# Patient Record
Sex: Male | Born: 1960 | Race: White | Hispanic: No | Marital: Married | Smoking: Current every day smoker
Health system: Southern US, Community
[De-identification: ages and names within clinical notes are randomized; demographics above are authoritative.]

## PROBLEM LIST (undated history)

## (undated) DIAGNOSIS — I219 Acute myocardial infarction, unspecified: Secondary | ICD-10-CM

## (undated) DIAGNOSIS — I509 Heart failure, unspecified: Secondary | ICD-10-CM

## (undated) DIAGNOSIS — F329 Major depressive disorder, single episode, unspecified: Secondary | ICD-10-CM

## (undated) DIAGNOSIS — E785 Hyperlipidemia, unspecified: Secondary | ICD-10-CM

## (undated) DIAGNOSIS — E119 Type 2 diabetes mellitus without complications: Secondary | ICD-10-CM

## (undated) DIAGNOSIS — T7840XA Allergy, unspecified, initial encounter: Secondary | ICD-10-CM

## (undated) DIAGNOSIS — M1990 Unspecified osteoarthritis, unspecified site: Secondary | ICD-10-CM

## (undated) DIAGNOSIS — I10 Essential (primary) hypertension: Secondary | ICD-10-CM

## (undated) DIAGNOSIS — F32A Depression, unspecified: Secondary | ICD-10-CM

## (undated) DIAGNOSIS — K219 Gastro-esophageal reflux disease without esophagitis: Secondary | ICD-10-CM

## (undated) DIAGNOSIS — F419 Anxiety disorder, unspecified: Secondary | ICD-10-CM

## (undated) DIAGNOSIS — G629 Polyneuropathy, unspecified: Secondary | ICD-10-CM

## (undated) HISTORY — DX: Allergy, unspecified, initial encounter: T78.40XA

## (undated) HISTORY — DX: Depression, unspecified: F32.A

## (undated) HISTORY — PX: CARDIAC VALVE REPLACEMENT: SHX585

## (undated) HISTORY — DX: Unspecified osteoarthritis, unspecified site: M19.90

## (undated) HISTORY — DX: Heart failure, unspecified: I50.9

## (undated) HISTORY — DX: Anxiety disorder, unspecified: F41.9

## (undated) HISTORY — DX: Acute myocardial infarction, unspecified: I21.9

## (undated) HISTORY — DX: Major depressive disorder, single episode, unspecified: F32.9

---

## 2005-11-28 ENCOUNTER — Ambulatory Visit: Payer: Self-pay | Admitting: Urology

## 2008-10-17 ENCOUNTER — Emergency Department: Payer: Self-pay | Admitting: Emergency Medicine

## 2014-11-27 ENCOUNTER — Emergency Department: Payer: Medicaid Other

## 2014-11-27 ENCOUNTER — Encounter
Admission: EM | Disposition: A | Discharge status: Home / Self Care | Payer: Self-pay | Source: Home / Self Care | Attending: Cardiovascular Disease

## 2014-11-27 ENCOUNTER — Inpatient Hospital Stay
Admission: EM | Admit: 2014-11-27 | Discharge: 2014-11-29 | DRG: 247 | Disposition: A | Discharge status: Home / Self Care | Payer: Medicaid Other | Attending: Cardiovascular Disease | Admitting: Cardiovascular Disease

## 2014-11-27 ENCOUNTER — Encounter: Payer: Self-pay | Admitting: Emergency Medicine

## 2014-11-27 ENCOUNTER — Inpatient Hospital Stay (HOSPITAL_COMMUNITY)
Admit: 2014-11-27 | Discharge: 2014-11-27 | Disposition: A | Discharge status: Home / Self Care | Payer: Medicaid Other | Attending: Cardiovascular Disease | Admitting: Cardiovascular Disease

## 2014-11-27 DIAGNOSIS — I2119 ST elevation (STEMI) myocardial infarction involving other coronary artery of inferior wall: Secondary | ICD-10-CM | POA: Diagnosis present

## 2014-11-27 DIAGNOSIS — M549 Dorsalgia, unspecified: Secondary | ICD-10-CM | POA: Diagnosis present

## 2014-11-27 DIAGNOSIS — Z886 Allergy status to analgesic agent status: Secondary | ICD-10-CM | POA: Diagnosis not present

## 2014-11-27 DIAGNOSIS — K219 Gastro-esophageal reflux disease without esophagitis: Secondary | ICD-10-CM | POA: Diagnosis present

## 2014-11-27 DIAGNOSIS — E785 Hyperlipidemia, unspecified: Secondary | ICD-10-CM | POA: Diagnosis present

## 2014-11-27 DIAGNOSIS — R079 Chest pain, unspecified: Secondary | ICD-10-CM | POA: Insufficient documentation

## 2014-11-27 DIAGNOSIS — Z833 Family history of diabetes mellitus: Secondary | ICD-10-CM

## 2014-11-27 DIAGNOSIS — G8929 Other chronic pain: Secondary | ICD-10-CM | POA: Diagnosis present

## 2014-11-27 DIAGNOSIS — F17210 Nicotine dependence, cigarettes, uncomplicated: Secondary | ICD-10-CM | POA: Diagnosis present

## 2014-11-27 DIAGNOSIS — E1142 Type 2 diabetes mellitus with diabetic polyneuropathy: Secondary | ICD-10-CM | POA: Diagnosis present

## 2014-11-27 DIAGNOSIS — Z716 Tobacco abuse counseling: Secondary | ICD-10-CM | POA: Insufficient documentation

## 2014-11-27 DIAGNOSIS — I2510 Atherosclerotic heart disease of native coronary artery without angina pectoris: Secondary | ICD-10-CM

## 2014-11-27 DIAGNOSIS — I2121 ST elevation (STEMI) myocardial infarction involving left circumflex coronary artery: Secondary | ICD-10-CM | POA: Diagnosis not present

## 2014-11-27 DIAGNOSIS — G629 Polyneuropathy, unspecified: Secondary | ICD-10-CM | POA: Diagnosis present

## 2014-11-27 DIAGNOSIS — I10 Essential (primary) hypertension: Secondary | ICD-10-CM | POA: Diagnosis present

## 2014-11-27 DIAGNOSIS — I209 Angina pectoris, unspecified: Secondary | ICD-10-CM | POA: Diagnosis not present

## 2014-11-27 DIAGNOSIS — Z8249 Family history of ischemic heart disease and other diseases of the circulatory system: Secondary | ICD-10-CM

## 2014-11-27 DIAGNOSIS — I213 ST elevation (STEMI) myocardial infarction of unspecified site: Secondary | ICD-10-CM

## 2014-11-27 DIAGNOSIS — E1165 Type 2 diabetes mellitus with hyperglycemia: Secondary | ICD-10-CM | POA: Diagnosis present

## 2014-11-27 DIAGNOSIS — Z720 Tobacco use: Secondary | ICD-10-CM | POA: Diagnosis not present

## 2014-11-27 DIAGNOSIS — R001 Bradycardia, unspecified: Secondary | ICD-10-CM | POA: Diagnosis present

## 2014-11-27 HISTORY — DX: Hyperlipidemia, unspecified: E78.5

## 2014-11-27 HISTORY — DX: Gastro-esophageal reflux disease without esophagitis: K21.9

## 2014-11-27 HISTORY — DX: Type 2 diabetes mellitus without complications: E11.9

## 2014-11-27 HISTORY — PX: CARDIAC CATHETERIZATION: SHX172

## 2014-11-27 HISTORY — DX: Essential (primary) hypertension: I10

## 2014-11-27 HISTORY — DX: Polyneuropathy, unspecified: G62.9

## 2014-11-27 LAB — CBC WITH DIFFERENTIAL/PLATELET
BASOS ABS: 0.1 10*3/uL (ref 0–0.1)
BASOS PCT: 1 %
Eosinophils Absolute: 0.3 10*3/uL (ref 0–0.7)
Eosinophils Relative: 2 %
HCT: 53.6 % — ABNORMAL HIGH (ref 40.0–52.0)
Hemoglobin: 17.9 g/dL (ref 13.0–18.0)
LYMPHS ABS: 4 10*3/uL — AB (ref 1.0–3.6)
LYMPHS PCT: 29 %
MCH: 29.5 pg (ref 26.0–34.0)
MCHC: 33.4 g/dL (ref 32.0–36.0)
MCV: 88.3 fL (ref 80.0–100.0)
MONO ABS: 1.2 10*3/uL — AB (ref 0.2–1.0)
Monocytes Relative: 9 %
Neutro Abs: 8.5 10*3/uL — ABNORMAL HIGH (ref 1.4–6.5)
Neutrophils Relative %: 59 %
Platelets: 214 10*3/uL (ref 150–440)
RBC: 6.07 MIL/uL — ABNORMAL HIGH (ref 4.40–5.90)
RDW: 12.8 % (ref 11.5–14.5)
WBC: 14.1 10*3/uL — AB (ref 3.8–10.6)

## 2014-11-27 LAB — BASIC METABOLIC PANEL
Anion gap: 13 (ref 5–15)
BUN: 12 mg/dL (ref 6–20)
CALCIUM: 9.7 mg/dL (ref 8.9–10.3)
CO2: 24 mmol/L (ref 22–32)
CREATININE: 0.93 mg/dL (ref 0.61–1.24)
Chloride: 101 mmol/L (ref 101–111)
GFR calc Af Amer: >60 mL/min (ref >60)
GFR calc non Af Amer: >60 mL/min (ref >60)
GLUCOSE: 332 mg/dL — AB (ref 65–99)
Potassium: 4.1 mmol/L (ref 3.5–5.1)
Sodium: 138 mmol/L (ref 135–145)

## 2014-11-27 LAB — GLUCOSE, CAPILLARY
Glucose-Capillary: 218 mg/dL — ABNORMAL HIGH (ref 65–99)
Glucose-Capillary: 223 mg/dL — ABNORMAL HIGH (ref 65–99)
Glucose-Capillary: 249 mg/dL — ABNORMAL HIGH (ref 65–99)
Glucose-Capillary: 404 mg/dL — ABNORMAL HIGH (ref 65–99)

## 2014-11-27 LAB — TROPONIN I
Troponin I: <0.03 ng/mL (ref <0.031)
Troponin I: 32.86 ng/mL — ABNORMAL HIGH (ref <0.031)

## 2014-11-27 SURGERY — CORONARY STENT INTERVENTION

## 2014-11-27 MED ORDER — NITROGLYCERIN 0.4 MG SL SUBL
0.4000 mg | SUBLINGUAL_TABLET | SUBLINGUAL | Status: DC | PRN
  Administered 2014-11-27 (×2): 0.4 mg via SUBLINGUAL

## 2014-11-27 MED ORDER — HEPARIN (PORCINE) IN NACL 2-0.9 UNIT/ML-% IJ SOLN
INTRAMUSCULAR | Status: AC
  Filled 2014-11-27: qty 1000

## 2014-11-27 MED ORDER — PRASUGREL HCL 10 MG PO TABS
10.0000 mg | ORAL_TABLET | Freq: Every day | ORAL | Status: DC
  Administered 2014-11-28 – 2014-11-29 (×2): 10 mg via ORAL
  Filled 2014-11-27 (×2): qty 1

## 2014-11-27 MED ORDER — BENAZEPRIL HCL 10 MG PO TABS
10.0000 mg | ORAL_TABLET | Freq: Every day | ORAL | Status: DC
  Administered 2014-11-28 – 2014-11-29 (×2): 10 mg via ORAL
  Filled 2014-11-27: qty 1
  Filled 2014-11-27: qty 0.5

## 2014-11-27 MED ORDER — SODIUM CHLORIDE 0.9 % IV SOLN
INTRAVENOUS | Status: AC
  Administered 2014-11-27: 11:00:00 via INTRAVENOUS

## 2014-11-27 MED ORDER — BIVALIRUDIN BOLUS VIA INFUSION - CUPID
INTRAVENOUS | Status: DC | PRN
  Administered 2014-11-27: 76.575 mg via INTRAVENOUS

## 2014-11-27 MED ORDER — HEPARIN SODIUM (PORCINE) 5000 UNIT/ML IJ SOLN
4000.0000 [IU] | Freq: Once | INTRAMUSCULAR | Status: AC
  Administered 2014-11-27: 4000 [IU] via INTRAVENOUS

## 2014-11-27 MED ORDER — IOHEXOL 300 MG/ML  SOLN
INTRAMUSCULAR | Status: DC | PRN
  Administered 2014-11-27: 195 mL via INTRAVENOUS

## 2014-11-27 MED ORDER — ASPIRIN 81 MG PO CHEW
81.0000 mg | CHEWABLE_TABLET | Freq: Every day | ORAL | Status: DC
  Administered 2014-11-28 – 2014-11-29 (×2): 81 mg via ORAL
  Filled 2014-11-27 (×2): qty 1

## 2014-11-27 MED ORDER — SODIUM CHLORIDE 0.9 % IJ SOLN: 3.0000 mL | INTRAMUSCULAR | Status: DC | PRN

## 2014-11-27 MED ORDER — SODIUM CHLORIDE 0.9 % IJ SOLN
3.0000 mL | Freq: Two times a day (BID) | INTRAMUSCULAR | Status: DC
  Administered 2014-11-27 – 2014-11-28 (×3): 3 mL via INTRAVENOUS

## 2014-11-27 MED ORDER — ATORVASTATIN CALCIUM 20 MG PO TABS
80.0000 mg | ORAL_TABLET | Freq: Every day | ORAL | Status: DC
  Administered 2014-11-27 – 2014-11-28 (×2): 80 mg via ORAL
  Filled 2014-11-27: qty 4
  Filled 2014-11-27: qty 1
  Filled 2014-11-27: qty 4

## 2014-11-27 MED ORDER — PRASUGREL HCL 10 MG PO TABS
ORAL_TABLET | ORAL | Status: DC | PRN
  Administered 2014-11-27: 60 mg via ORAL

## 2014-11-27 MED ORDER — BENAZEPRIL HCL 5 MG PO TABS
5.0000 mg | ORAL_TABLET | Freq: Once | ORAL | Status: AC
  Administered 2014-11-27: 5 mg via ORAL
  Filled 2014-11-27 (×2): qty 1

## 2014-11-27 MED ORDER — ACETAMINOPHEN 325 MG PO TABS: 650.0000 mg | ORAL_TABLET | ORAL | Status: DC | PRN

## 2014-11-27 MED ORDER — ONDANSETRON HCL 4 MG/2ML IJ SOLN: 4.0000 mg | Freq: Four times a day (QID) | INTRAMUSCULAR | Status: DC | PRN

## 2014-11-27 MED ORDER — BENAZEPRIL HCL 5 MG PO TABS
5.0000 mg | ORAL_TABLET | Freq: Every day | ORAL | Status: DC
  Administered 2014-11-27: 5 mg via ORAL
  Filled 2014-11-27: qty 1

## 2014-11-27 MED ORDER — FENTANYL CITRATE (PF) 100 MCG/2ML IJ SOLN
INTRAMUSCULAR | Status: DC | PRN
  Administered 2014-11-27: 50 ug via INTRAVENOUS

## 2014-11-27 MED ORDER — NITROGLYCERIN 0.2 MG/ML ON CALL CATH LAB
INTRAVENOUS | Status: DC | PRN
  Administered 2014-11-27 (×2): 200 ug via INTRACORONARY

## 2014-11-27 MED ORDER — ALPRAZOLAM 0.5 MG PO TABS
0.5000 mg | ORAL_TABLET | Freq: Two times a day (BID) | ORAL | Status: DC | PRN
  Administered 2014-11-27 – 2014-11-29 (×3): 0.5 mg via ORAL
  Filled 2014-11-27 (×3): qty 1

## 2014-11-27 MED ORDER — SODIUM CHLORIDE 0.9 % IV SOLN
250.0000 mg | INTRAVENOUS | Status: DC | PRN
  Administered 2014-11-27: 1.75 mg/kg/h via INTRAVENOUS

## 2014-11-27 MED ORDER — MIDAZOLAM HCL 2 MG/2ML IJ SOLN
INTRAMUSCULAR | Status: DC | PRN
  Administered 2014-11-27: 1 mg via INTRAVENOUS

## 2014-11-27 MED ORDER — BIVALIRUDIN 250 MG IV SOLR
INTRAVENOUS | Status: AC
Start: 2014-11-27 — End: 2014-11-27
  Filled 2014-11-27: qty 250

## 2014-11-27 MED ORDER — PRASUGREL HCL 10 MG PO TABS
ORAL_TABLET | ORAL | Status: AC
  Filled 2014-11-27: qty 6

## 2014-11-27 MED ORDER — INSULIN ASPART 100 UNIT/ML ~~LOC~~ SOLN
0.0000 [IU] | SUBCUTANEOUS | Status: DC
  Administered 2014-11-27: 8 [IU] via SUBCUTANEOUS
  Administered 2014-11-27: 20 [IU] via SUBCUTANEOUS
  Administered 2014-11-27 – 2014-11-28 (×3): 8 [IU] via SUBCUTANEOUS
  Administered 2014-11-28: 4 [IU] via SUBCUTANEOUS
  Filled 2014-11-27: qty 4
  Filled 2014-11-27: qty 12
  Filled 2014-11-27 (×2): qty 8
  Filled 2014-11-27: qty 20
  Filled 2014-11-27: qty 8

## 2014-11-27 MED ORDER — MIDAZOLAM HCL 2 MG/2ML IJ SOLN
INTRAMUSCULAR | Status: AC
  Filled 2014-11-27: qty 2

## 2014-11-27 MED ORDER — NITROGLYCERIN 2 % TD OINT
0.5000 [in_us] | TOPICAL_OINTMENT | Freq: Four times a day (QID) | TRANSDERMAL | Status: DC
  Administered 2014-11-27 – 2014-11-28 (×4): 0.5 [in_us] via TOPICAL
  Filled 2014-11-27 (×4): qty 1

## 2014-11-27 MED ORDER — CARVEDILOL 3.125 MG PO TABS
3.1250 mg | ORAL_TABLET | Freq: Two times a day (BID) | ORAL | Status: DC
  Administered 2014-11-27 – 2014-11-29 (×4): 3.125 mg via ORAL
  Filled 2014-11-27 (×4): qty 1

## 2014-11-27 MED ORDER — SODIUM CHLORIDE 0.9 % IV SOLN: 250.0000 mL | INTRAVENOUS | Status: DC | PRN

## 2014-11-27 MED ORDER — FENTANYL CITRATE (PF) 100 MCG/2ML IJ SOLN
INTRAMUSCULAR | Status: AC
  Filled 2014-11-27: qty 2

## 2014-11-27 MED ORDER — ASPIRIN 81 MG PO CHEW
324.0000 mg | CHEWABLE_TABLET | Freq: Once | ORAL | Status: AC
  Administered 2014-11-27: 324 mg via ORAL

## 2014-11-27 MED ORDER — VERAPAMIL HCL 2.5 MG/ML IV SOLN
INTRAVENOUS | Status: AC
  Filled 2014-11-27: qty 2

## 2014-11-27 MED ORDER — VERAPAMIL HCL 2.5 MG/ML IV SOLN
INTRAVENOUS | Status: DC | PRN
  Administered 2014-11-27: 10:00:00 via INTRA_ARTERIAL

## 2014-11-27 MED ORDER — NITROGLYCERIN 5 MG/ML IV SOLN
INTRAVENOUS | Status: AC
  Filled 2014-11-27: qty 10

## 2014-11-27 SURGICAL SUPPLY — 16 items
BALLN TREK RX 2.5X12 (BALLOONS) ×3
BALLN ~~LOC~~ TREK RX 3.25X12 (BALLOONS) ×3
BALLOON TREK RX 2.5X12 (BALLOONS) ×1 IMPLANT
BALLOON ~~LOC~~ TREK RX 3.25X12 (BALLOONS) ×1 IMPLANT
CATH HEARTRAIL 6F IL3.5 (CATHETERS) ×3 IMPLANT
CATH INFINITI 5 FR JL3.5 (CATHETERS) ×3 IMPLANT
CATH INFINITI 5FR ANG PIGTAIL (CATHETERS) ×3 IMPLANT
CATH OPTITORQUE JACKY 4.0 5F (CATHETERS) ×3 IMPLANT
DEVICE INFLAT 30 PLUS (MISCELLANEOUS) ×3 IMPLANT
DEVICE RAD TR BAND REGULAR (VASCULAR PRODUCTS) ×3 IMPLANT
GLIDESHEATH SLEND SS 6F .021 (SHEATH) ×3 IMPLANT
KIT MANI 3VAL PERCEP (MISCELLANEOUS) ×3 IMPLANT
PACK CARDIAC CATH (CUSTOM PROCEDURE TRAY) ×3 IMPLANT
STENT XIENCE ALPINE RX 3.0X28 (Permanent Stent) ×3 IMPLANT
WIRE INTUITION PROPEL ST 180CM (WIRE) ×3 IMPLANT
WIRE SAFE-T 1.5MM-J .035X260CM (WIRE) ×3 IMPLANT

## 2014-11-27 NOTE — Progress Notes (Signed)
*  PRELIMINARY RESULTS* Echocardiogram 2D Echocardiogram has been performed.  Ryan Bradshaw 11/27/2014, 6:42 PM

## 2014-11-27 NOTE — ED Notes (Signed)
Developed pain to mid chest about 1 hours pta with some diaphoresis

## 2014-11-27 NOTE — Progress Notes (Signed)
Dr. Kirke CorinArida notified of elevated troponin. No new orders obtained.

## 2014-11-27 NOTE — Progress Notes (Signed)
Noted consult for uncontrolled diabetes/STEMI. Spoke with patient about diabetes and home regimen for diabetes control. Patient reports that he is followed by his PCP for diabetes management and currently he takes Metformin 500 mg TID with meals as an outpatient for diabetes control. Patient reports that he has diabetes for quite a while but he was just started on DM medication about 4 months ago. Patient reports that he was initially started on Metformin 500 mg BID and he was asked to increase Metformin to 500 mg TID about  3 weeks ago. Inquired about knowledge about A1C and patient reports that he knows what an A1C is and states that his A1C was 11% about 4 months ago before starting on DM medications and he reports that his A1C was down to 8% about 3 weeks ago that is why he was asked to increase Metformin to TID. Reviewed what an A1C is and importance of maintaining good CBG control to prevent long-term and short-term complications. Discussed impact of nutrition, exercise, stress, sickness, and medications on diabetes control.  Patient asked about why he was not receiving Metformin at the hospital. Informed patient that insulin will likely be used while he is an inpatient in an effort to maintain a tighter glycemic control. Discussed Novolog insulin as ordered and reviewed signs and symptoms of hypoglycemia. Patient states that he has experienced a low glucose in the past and he knows how it makes him feel (sweaty, shaky, and confused).  Encouraged patient to call nursing staff immediately if at any time he felt his glucose was low. Informed patient our group will continue to follow his glucose trends while he is inpatient and make recommendations if necessary.  Patient verbalized understanding of information discussed and he states that he has no further questions at this time related to diabetes.   Thanks, Orlando PennerMarie Cassiel Fernandez, RN, MSN, CCRN, CDE Diabetes Coordinator Inpatient Diabetes Program [phone] (Team  Pager M-F from 8am - [phone]) [phone] ([facility] office) [phone] [facility] office) [phone] [facility] office)

## 2014-11-27 NOTE — Progress Notes (Signed)
PHARMACY - CRITICAL CARE PROGRESS NOTE  Pharmacy Consult for Electrolyte Management  Indication: ICU Status   Allergies  Allergen Reactions  . Naproxen Other (See Comments)    Reaction:  Blood in stools    Patient Measurements: Height: 6\' 2"  (188 cm) Weight: 225 lb (102.059 kg) IBW/kg (Calculated) : 82.2   Vital Signs: Temp: 97.8 F (36.6 C) (05/23 1900) Temp Source: Oral (05/23 1900) BP: 154/101 mmHg (05/23 1900) Pulse Rate: 58 (05/23 1900) Intake/Output from previous day:   Intake/Output from this shift:   Vent settings for last 24 hours:    Labs:  Recent Labs  11/27/14 0919  WBC 14.1*  HGB 17.9  HCT 53.6*  PLT 214  CREATININE 0.93   Estimated Creatinine Clearance: 118.5 mL/min (by C-G formula based on Cr of 0.93).   Recent Labs  11/27/14 1112 11/27/14 1622 11/27/14 2022  GLUCAP 404* 223* 249*    Microbiology: No results found for this or any previous visit (from the past 720 hour(s)).  Medications:  Scheduled:  . aspirin  81 mg Oral Daily  . atorvastatin  80 mg Oral q1800  . [START ON 11/28/2014] benazepril  10 mg Oral Daily  . carvedilol  3.125 mg Oral BID WC  . insulin aspart  0-24 Units Subcutaneous 6 times per day  . nitroGLYCERIN  0.5 inch Topical 4 times per day  . prasugrel  10 mg Oral Daily  . sodium chloride  3 mL Intravenous Q12H   Infusions:  . sodium chloride 75 mL/hr at 11/27/14 1045    Assessment: 54 yo male admitted to ICU with ST elevation.    Plan:  Electrolytes are WNL. Glucose elevated.   Will obtain follow-up electrolytes with am labs.   Pharmacy will continue to monitor and adjust per consult.    Abrea Henle L 11/27/2014,8:59 PM

## 2014-11-27 NOTE — Progress Notes (Signed)
Dr. Kirke CorinArida notified about continued elevated BP and patient request for home medication (xanax) to be given. Orders received.  Nurse will continue to monitor.

## 2014-11-27 NOTE — ED Notes (Signed)
CARELINK  CALLED FOR  STEMI  SPOKE  WITH  [place]

## 2014-11-27 NOTE — CV Procedure (Signed)
Brief procedure note: Occluded mid LCX. S/p successful PCI and DES placement.  EF: 50%.

## 2014-11-27 NOTE — H&P (Signed)
Patient ID: Ryan Bradshaw, MRN: [id number], DOB/AGE: 1961-03-23 54 y.o. Admit date: 12-09-14   Date of Consult: 2014-12-09 Primary Physician: No primary care provider on file. Primary Cardiologist: [facility]  Chief Complaint: Chest pain Reason for Consult: STEMI  HPI: 54 y.o. male with h/o poorly controlled DM2, HTN, HLD, peripheral neuropathy, GERD, and chronic back and neck pain who presented to [facility] ED this morning at 9 AM with substernal chest pain and was found to have a ST elevateion in leads II, III, aVF V5-V6. He was taken emergently to the cardiac cath lab and was found to have an occluded mLCx s/p PCI/DES.   Patient does not have any previously known cardiac history with no prior stress tests, or cardiac caths. He was previously moderately active, but has since slowed in life 2/2 his chronic neck and back pain. He has known poorly controlled DM2 and takes metformin tid for this. He last saw his PCP, who he cannot name for me today 3 weeks ago and had his metformin increased from bid to tid. He cannot recall what his last A1C was or what his home glucose levels are. He last checked his home glucose approximately 2 weeks ago with readings in the >200s. He does not eat a healthy diet and does not get regular exercise. He has smoked tobacco since age 30 at 1.5 packs per day. He has tried patches and "pills" to quit without success, though notes he has not been ready to quit. He denies any history of drinking or illegal drugs.   He reports waking up this morning with substernal chest pain with associated nausea, diaphoresis, and SOB. He has never had pain like this before. This prompted him to come to the ED. Pain lasted until he got to the ED and received cardiac cath. Upon his arrival to [facility] ED he was found to have an EKG that showed sinus bradycardia, 55 bpm, ST elevations leads II, III, aVF, V5-V6. Troponin negative x1. CXR with no active disease. WBC 14.1. Glucose 332. He was taken  emergently to the cardiac cath lab and was found to have an occluded mid LCx s/p successful PCI/DES. EF 50%. He was placed on Angiomax post cath. Post cath he noted mild chest pain in the CCU. He was given nitro ointment. He was also placed on SSI for his blood sugars. The pharmacy was called to get record of his home medications.     Past Medical History  Diagnosis Date  . DM2 (diabetes mellitus, type 2)   . Hypertension   . Neuropathy   . GERD (gastroesophageal reflux disease)   . HLD (hyperlipidemia)       Most Recent Cardiac Studies: Cardiac catheterization 12/09/2014:  Occluded mid LCx s/p successful PCI/DES. EF 50%.    Surgical History: History reviewed. No pertinent past surgical history.   Home Meds: Prior to Admission medications   Not on File    Inpatient Medications:  . aspirin  81 mg Oral Daily  . atorvastatin  80 mg Oral q1800  . benazepril  5 mg Oral Daily  . carvedilol  3.125 mg Oral BID WC  . insulin aspart  0-24 Units Subcutaneous 6 times per day  . nitroGLYCERIN  0.5 inch Topical 4 times per day  . prasugrel  10 mg Oral Daily  . sodium chloride  3 mL Intravenous Q12H   . sodium chloride 75 mL/hr at 12/09/14 1045    Allergies:  Allergies  Allergen Reactions  .  Naproxen Other (See Comments)    Blood in stools    History   Social History  . Marital Status: Married    Spouse Name: N/A  . Number of Children: N/A  . Years of Education: N/A   Occupational History  . Not on file.   Social History Main Topics  . Smoking status: Current Every Day Smoker -- 1.00 packs/day    Types: Cigarettes  . Smokeless tobacco: Never Used  . Alcohol Use: No  . Drug Use: No  . Sexual Activity: Yes   Other Topics Concern  . Not on file   Social History Narrative  . No narrative on file     Family History  Problem Relation Age of Onset  . Hypertension Father   . Diabetes Father      Review of Systems: ROS  Labs:  Recent Labs  11/27/14 0919   TROPONINI <0.03   Lab Results  Component Value Date   WBC 14.1* 11/27/2014   HGB 17.9 11/27/2014   HCT 53.6* 11/27/2014   MCV 88.3 11/27/2014   PLT 214 11/27/2014     Recent Labs Lab 11/27/14 0919  NA 138  K 4.1  CL 101  CO2 24  BUN 12  CREATININE 0.93  CALCIUM 9.7  GLUCOSE 332*   No results found for: CHOL, HDL, LDLCALC, TRIG No results found for: DDIMER  Radiology/Studies:  Dg Chest Port 1 View  11/27/2014   CLINICAL DATA:  Mid sternal chest pain starting today, smoker  EXAM: PORTABLE CHEST - 1 VIEW  COMPARISON:  None.  FINDINGS: Cardiomediastinal silhouette is unremarkable. No acute infiltrate or pleural effusion. No pulmonary edema. Bony thorax is unremarkable.  IMPRESSION: No active disease.   Electronically Signed   By: Natasha MeadLiviu  Pop M.D.   On: 11/27/2014 09:31    EKG: sinus bradycardia, 55 bpm, ST elevation II, III, aVF, V5-V6  Weights: Filed Weights   11/27/14 0900  Weight: 225 lb (102.059 kg)     Physical Exam: Blood pressure 158/100, pulse 72, temperature 97.9 F (36.6 C), temperature source Oral, resp. rate 16, height 6\' 2"  (1.88 m), weight 225 lb (102.059 kg), SpO2 97 %. Body mass index is 28.88 kg/(m^2). General: Well developed, well nourished, in no acute distress. Head: Normocephalic, atraumatic, sclera non-icteric, no xanthomas, nares are without discharge.  Neck: Negative for carotid bruits. JVD not elevated. Lungs: Clear bilaterally to auscultation without wheezes, rales, or rhonchi. Breathing is unlabored. Heart: RRR with S1 S2. No murmurs, rubs, or gallops appreciated. Abdomen: Soft, non-tender, non-distended with normoactive bowel sounds. No hepatomegaly. No rebound/guarding. No obvious abdominal masses. Msk:  Strength and tone appear normal for age. Extremities: No clubbing or cyanosis. No edema.  Distal pedal pulses are 2+ and equal bilaterally. Cardiac cath site with bandage in place. No active bleeding, bruising, swelling, erythema, or TTP.   Neuro: Alert and oriented X 3. No facial asymmetry. No focal deficit. Moves all extremities spontaneously. Psych:  Responds to questions appropriately with a normal affect.    Assessment and Plan:  54 y.o. male with h/o poorly controlled DM2, HTN, HLD, peripheral neuropathy, GERD, and chronic back and neck pain who presented to [facility] ED this morning at 9 AM with substernal chest pain and was found to have a ST elevateion in leads II, III, aVF V5-V6. He was taken emergently to the cardiac cath lab and was found to have an occluded mLCx s/p PCI/DES.   1. STEMI: -Presented with occluded mid LCx s/p successful  PCI/DES, EF 50% by LV gram -Continue DAPT for the next 12 months (aspirin 81 mg and Effient 10 mg daily) -Check echo to evaluate LV function and wall motion -Lipitor 80 mg  -Coreg 3.125 mg bid -Mild residual chest pain - nitro past 0.5 inch -Advance activity as tolerated later this evening/tonight -Cardiac rehab  2. Uncontrolled DM2: -Hold metformin until 5/25 (48 hours post cath) -Blood 404 this morning -SSI -Diabetic educator consult  3. HTN: -Uncontrolled -Coreg started as above -Titrate as BP and pulse allow  4. HLD: -Lipitor 80 mg -Check FLP in AM  5. Chronic back pain: -Stable currently  6. Dispo: -Case manager consult to aid in medication management  -Have pharmacy reconcile home medications   Signed, Eula Listen, PA-C Pager: [phone] 11/27/2014, 12:07 PM

## 2014-11-27 NOTE — ED Notes (Signed)
CARELINK  CALLED FOR  STEMI  SPOKE  WITH  [place]OMAS 

## 2014-11-27 NOTE — Progress Notes (Signed)
Chaplain responds to code stemi,met with patient and spouse along with care team. Chaplain provided prayer and emotional support.  Loralyn Freshwater D. Alroy Dust [phone]   11/27/14 0915  Clinical Encounter Type  Visited With Patient and family together  Visit Type Code (Code Stemi @ 9:15 am)  Referral From Nurse  Consult/Referral To Chaplain  Spiritual Encounters  Spiritual Needs Prayer;Emotional  Stress Factors  Patient Stress Factors Health changes (chaplain responds to code stemi)  Family Stress Factors Health changes (Spouse at the bedside of patient during proceedure)

## 2014-11-27 NOTE — ED Provider Notes (Signed)
[facility] Emergency Department Provider Note   ____________________________________________  Time seen: 9:06 AM I have reviewed the triage vital signs and the triage nursing note.  HISTORY  Chief Complaint Chest Pain   Historian Patient and wife  HPI Ryan Bradshaw is a 54 y.o. male who is complaining of moderate substernal and left-sided chest pain since a proximally 1 hour ago. It's been constant. It occurred at rest. He's had nausea and sweating. He's had no shortness of breath. He is treated for blood pressure and diabetes and he is a smoker, but no known coronary artery disease or prior MI.    Past Medical History  Diagnosis Date  . Diabetes mellitus without complication   . Hypertension   . Neuropathy   . GERD (gastroesophageal reflux disease)     There are no active problems to display for this patient.   History reviewed. No pertinent past surgical history.  No current outpatient prescriptions on file.  Allergies Naproxen  No family history on file.  Social History History  Substance Use Topics  . Smoking status: Current Every Day Smoker  . Smokeless tobacco: Not on file  . Alcohol Use: No    Review of Systems  Constitutional: Negative for fever. Eyes: Negative for visual changes. ENT: Negative for sore throat. Cardiovascular: Negative for palpitations Respiratory: Negative for shortness of breath. Gastrointestinal: Negative for abdominal pain, vomiting and diarrhea. Genitourinary: Negative for dysuria. Musculoskeletal: Negative for back pain. Skin: Negative for rash. Neurological: Negative for headaches, focal weakness or numbness.  ____________________________________________   PHYSICAL EXAM:  VITAL SIGNS: ED Triage Vitals  Enc Vitals Group     BP 11/27/14 0900 185/105 mmHg     Pulse Rate 11/27/14 0900 60     Resp 11/27/14 0900 18     Temp 11/27/14 0900 98 F (36.7 C)     Temp Source 11/27/14 0900 Oral      SpO2 11/27/14 0900 98 %     Weight 11/27/14 0900 225 lb (102.059 kg)     Height 11/27/14 0900  (1.88 m)     Head Cir --      Peak Flow --      Pain Score 11/27/14 0856 5     Pain Loc --      Pain Edu? --      Excl. in GC? --      Constitutional: Alert and oriented. Well appearing and in no distress. Smells of smoke Eyes: Conjunctivae are normal. PERRL. Normal extraocular movements. ENT   Head: Normocephalic and atraumatic.   Nose: No congestion/rhinnorhea.   Mouth/Throat: Mucous membranes are moist.   Neck: No stridor. Cardiovascular: Normal rate, regular rhythm.  No murmurs, rubs, or gallops. Respiratory: Normal respiratory effort without tachypnea nor retractions. Breath sounds are clear and equal bilaterally. No wheezes/rales/rhonchi. Gastrointestinal: Soft and nontender. No distention.   Musculoskeletal: Nontender with normal range of motion in all extremities. No joint effusions.  No lower extremity tenderness nor edema. Neurologic:  Normal speech and language. No gross focal neurologic deficits are appreciated. Skin:  Skin is warm, patient is diaphoretic. No rash noted. Psychiatric: Mood and affect are normal. Speech and behavior are normal. Patient exhibits appropriate insight and judgment.  ____________________________________________   EKG  I, Governor Rooks, MD, the attending physician have personally viewed and interpreted this ECG.  55 bpm. Narrow QRS. Normal axis. Normal sinus rhythm. ST segment elevation 23 and aVF 1 small box. As T depression in V1 and V2  and mildly in V3. There are no prior EKGs to compare ____________________________________________  LABS (pertinent positives/negatives)  Pending  ____________________________________________  RADIOLOGY   Chest x-ray portable: Viewed by me, interpreted by me radiologist results reading is pending Clear lungs without infiltrate, no  cardiomegaly. __________________________________________  PROCEDURES  Procedure(s) performed: None Critical Care performed: 30 minutes  CRITICAL CARE Performed by: Governor RooksLORD, Ariv Penrod   Total critical care time: 30 minutes  Critical care time was exclusive of separately billable procedures and treating other patients.  Critical care was necessary to treat or prevent imminent or life-threatening deterioration.  Critical care was time spent personally by me on the following activities: development of treatment plan with patient and/or surrogate as well as nursing, discussions with consultants, evaluation of patient's response to treatment, examination of patient, obtaining history from patient or surrogate, ordering and performing treatments and interventions, ordering and review of laboratory studies, ordering and review of radiographic studies, pulse oximetry and re-evaluation of patient's condition.   ____________________________________________   ED COURSE / ASSESSMENT AND PLAN  Pertinent labs & imaging results that were available during my care of the patient were reviewed by me and considered in my medical decision making (see chart for details).   EKG was showed to me from triage and was concerning for ST segment elevation MI inferiorly versus J-point elevation. I asked the patient be brought back immediately. Patient's clinical history and exam are concerning for acute myocardial infarction with left-sided chest pain up into his jaw and left arm with associated nausea and sweating in a patient with multiple cardiac risk factors. I did alert the STEMI team and started patient on 324 aspirin, subungual nitros.   Dr. Kirke CorinArida arrived to the bedside at about 9:20 AM and after evaluation the patient and the EKG decided to take the patient for emergent cardiac catheterization.  First nitroglycerin did help somewhat and the blood pressure was still elevated, secondary to was given. After  reviewing the chest x-ray, with Dr. Kirke CorinArida at the bedside, 4000 unit heparin bolus was initiated.  30 minutes was spent at the bedside assessing, treating patient's emergency condition and obtain the family and consulting with the cardiologist ___________________________________________   FINAL CLINICAL IMPRESSION(S) / ED DIAGNOSES   Final diagnoses:  ST elevation myocardial infarction (STEMI), unspecified artery  Chest pain, unspecified chest pain type      Governor Rooksebecca Aliviya Schoeller, MD 11/27/14 [phone]

## 2014-11-28 ENCOUNTER — Encounter: Payer: Self-pay | Admitting: Cardiovascular Disease

## 2014-11-28 DIAGNOSIS — E785 Hyperlipidemia, unspecified: Secondary | ICD-10-CM

## 2014-11-28 DIAGNOSIS — Z716 Tobacco abuse counseling: Secondary | ICD-10-CM

## 2014-11-28 DIAGNOSIS — R079 Chest pain, unspecified: Secondary | ICD-10-CM | POA: Insufficient documentation

## 2014-11-28 DIAGNOSIS — Z720 Tobacco use: Secondary | ICD-10-CM

## 2014-11-28 LAB — CBC
HEMATOCRIT: 46.7 % (ref 40.0–52.0)
Hemoglobin: 15.5 g/dL (ref 13.0–18.0)
MCH: 29.2 pg (ref 26.0–34.0)
MCHC: 33.2 g/dL (ref 32.0–36.0)
MCV: 87.7 fL (ref 80.0–100.0)
PLATELETS: 158 10*3/uL (ref 150–440)
RBC: 5.33 MIL/uL (ref 4.40–5.90)
RDW: 12.8 % (ref 11.5–14.5)
WBC: 15.7 10*3/uL — ABNORMAL HIGH (ref 3.8–10.6)

## 2014-11-28 LAB — LIPID PANEL
CHOL/HDL RATIO: 7.9 ratio
Cholesterol: 236 mg/dL — ABNORMAL HIGH (ref 0–200)
HDL: 30 mg/dL — AB (ref >40)
LDL Cholesterol: 148 mg/dL — ABNORMAL HIGH (ref 0–99)
Triglycerides: 291 mg/dL — ABNORMAL HIGH (ref <150)
VLDL: 58 mg/dL — ABNORMAL HIGH (ref 0–40)

## 2014-11-28 LAB — BASIC METABOLIC PANEL
ANION GAP: 8 (ref 5–15)
BUN: 9 mg/dL (ref 6–20)
CO2: 26 mmol/L (ref 22–32)
CREATININE: 0.73 mg/dL (ref 0.61–1.24)
Calcium: 9.2 mg/dL (ref 8.9–10.3)
Chloride: 104 mmol/L (ref 101–111)
GFR calc Af Amer: >60 mL/min (ref >60)
Glucose, Bld: 189 mg/dL — ABNORMAL HIGH (ref 65–99)
Potassium: 3.7 mmol/L (ref 3.5–5.1)
SODIUM: 138 mmol/L (ref 135–145)

## 2014-11-28 LAB — PHOSPHORUS: Phosphorus: 3.1 mg/dL (ref 2.5–4.6)

## 2014-11-28 LAB — MAGNESIUM: Magnesium: 2.1 mg/dL (ref 1.7–2.4)

## 2014-11-28 LAB — GLUCOSE, CAPILLARY
Glucose-Capillary: 186 mg/dL — ABNORMAL HIGH (ref 65–99)
Glucose-Capillary: 214 mg/dL — ABNORMAL HIGH (ref 65–99)
Glucose-Capillary: 267 mg/dL — ABNORMAL HIGH (ref 65–99)
Glucose-Capillary: 226 mg/dL — ABNORMAL HIGH (ref 65–99)
Glucose-Capillary: 243 mg/dL — ABNORMAL HIGH (ref 65–99)

## 2014-11-28 MED ORDER — INSULIN GLARGINE 100 UNIT/ML ~~LOC~~ SOLN
15.0000 [IU] | Freq: Every day | SUBCUTANEOUS | Status: DC
  Administered 2014-11-28: 15 [IU] via SUBCUTANEOUS
  Filled 2014-11-28 (×3): qty 0.15

## 2014-11-28 MED ORDER — INSULIN ASPART 100 UNIT/ML ~~LOC~~ SOLN
0.0000 [IU] | Freq: Every day | SUBCUTANEOUS | Status: DC
  Administered 2014-11-28: 2 [IU] via SUBCUTANEOUS

## 2014-11-28 MED ORDER — GABAPENTIN 300 MG PO CAPS
300.0000 mg | ORAL_CAPSULE | Freq: Every day | ORAL | Status: DC
  Filled 2014-11-28 (×2): qty 1

## 2014-11-28 MED ORDER — INSULIN ASPART 100 UNIT/ML ~~LOC~~ SOLN
0.0000 [IU] | Freq: Three times a day (TID) | SUBCUTANEOUS | Status: DC
  Administered 2014-11-28: 11 [IU] via SUBCUTANEOUS
  Administered 2014-11-28: 7 [IU] via SUBCUTANEOUS
  Administered 2014-11-29: 11 [IU] via SUBCUTANEOUS
  Filled 2014-11-28 (×2): qty 11
  Filled 2014-11-28: qty 2
  Filled 2014-11-28: qty 7

## 2014-11-28 NOTE — Progress Notes (Signed)
Inpatient Diabetes Program Recommendations  AACE/ADA: New Consensus Statement on Inpatient Glycemic Control (2013)  Target Ranges:  Prepandial:   less than 140 mg/dL      Peak postprandial:   less than 180 mg/dL (1-2 hours)      Critically ill patients:  140 - 180 mg/dL   Results for Ryan Bradshaw, Ryan Bradshaw (MRN [id number]) as of 11/28/2014 10:32  Ref. Range 11/27/2014 11:12 11/27/2014 16:22 11/27/2014 20:22 11/27/2014 23:44 11/28/2014 03:43 11/28/2014 07:44  Glucose-Capillary Latest Ref Range: 65-99 mg/dL 409404 (H) 811223 (H) 914249 (H) 218 (H) 186 (H) 214 (H)   Diabetes history: DM2 Outpatient Diabetes medications: Metformin 500 mg TID with meals Current orders for Inpatient glycemic control: Novolog 0-24 units Q4H  Inpatient Diabetes Program Recommendations Insulin - Basal: Over the past 16 hours CBG has ranged from 186-249 mg/dl and patient has received a total of Novolog 36 units for correction. Please consider order low dose basal insulin while inpatient. Recommend ordering Lantus 15 units daily (to start now). Correction (SSI): Since patient is eating and will be transferred out of ICU, please consider discontinuing current Novolog correction and use Glycemic Control order set and order Novolog resistant scale ACHS.  Thanks, Orlando PennerMarie Ryman Rathgeber, RN, MSN, CCRN, CDE Diabetes Coordinator Inpatient Diabetes Program [phone] (Team Pager from 8am to 5pm) [phone] ([facility] office) [phone] [facility] office) [phone] [facility] office)

## 2014-11-28 NOTE — Evaluation (Signed)
Physical Therapy Evaluation Patient Details Name: Ryan Bradshaw MRN: [id number] DOB: 24-Dec-1960 Today's Date: 11/28/2014   History of Present Illness  54 y.o. male with h/o poorly controlled DM2, HTN, HLD, peripheral neuropathy, GERD, and chronic back and neck pain who presented to [facility] ED this morning 11/27/14 at 9 AM with substernal chest pain and was found to have a ST elevateion in leads II, III, aVF V5-V6. He was taken immediately to the cardiac cath lab and was found to have an occluded mLCx s/p PCI/DES.   Clinical Impression  54 yo Male s/p STEMI with recent cardiac cath. Patient reports no chest pain or shortness of breath. He reports being independent in all self care ADLs prior to admittance, although was slow with tasks due to chronic neck/back pain at baseline. Currently he is independent in bed mobility, transfers and gait tasks (short distances, 100 feet); Patient reports currently functioning at baseline. He demonstrates good static/dynamic standing balance. He is not appropriate for skilled PT intervention as he is functioning at baseline. Patient agreeable and anxious to go home.    Follow Up Recommendations No PT follow up    Equipment Recommendations       Recommendations for Other Services       Precautions / Restrictions        Mobility  Bed Mobility Overal bed mobility: Independent             General bed mobility comments: without bed rails and with flat bed;  Transfers Overall transfer level: Independent               General transfer comment: transfer safely without AD  Ambulation/Gait Ambulation/Gait assistance: Independent Ambulation Distance (Feet): 100 Feet Assistive device: None Gait Pattern/deviations: WFL(Within Functional Limits)     General Gait Details: Demonstrates slow gait speed, but ambulates with reciprocal gait pattern, good foot clearance   Stairs            Wheelchair Mobility    Modified Rankin (Stroke  Patients Only)       Balance Overall balance assessment: Independent                                           Pertinent Vitals/Pain Pain Assessment: 0-10 Pain Score: 5  Pain Location: Neck Pain Descriptors / Indicators: Aching;Burning;Sore;Sharp Pain Intervention(s): Repositioned (educated patient in ROM exercise to reduce neck discomfort)    Home Living Family/patient expects to be discharged to:: Private residence Living Arrangements: Children Available Help at Discharge: Family Type of Home: House Home Access: Level entry       Home Equipment: None      Prior Function Level of Independence: Independent         Comments: reports being independent in all ADLs including bathing/dressing; however reports taking longer time to complete tasks due to chronic pain     Hand Dominance   Dominant Hand: Right    Extremity/Trunk Assessment   Upper Extremity Assessment: Overall WFL for tasks assessed           Lower Extremity Assessment: Overall WFL for tasks assessed (BLE gross strength is WNL)      Cervical / Trunk Assessment: Kyphotic;Other exceptions  Communication   Communication: No difficulties  Cognition Arousal/Alertness: Awake/alert Behavior During Therapy: WFL for tasks assessed/performed Overall Cognitive Status: Within Functional Limits for tasks assessed  General Comments General comments (skin integrity, edema, etc.): good skin integrity; does have numbness/tingling in BUE and into LLE as related to cervical/lumbar radiculopathy; reports this is baseline; intact deep pressure    Exercises        Assessment/Plan    PT Assessment Patent does not need any further PT services  PT Diagnosis Difficulty walking   PT Problem List    PT Treatment Interventions     PT Goals (Current goals can be found in the Care Plan section) Acute Rehab PT Goals Patient Stated Goal: to go home PT Goal  Formulation: With patient Time For Goal Achievement: 11/28/14 Potential to Achieve Goals: Good    Frequency     Barriers to discharge        Co-evaluation               End of Session Equipment Utilized During Treatment: Gait belt (HR monitor) Activity Tolerance: Patient tolerated treatment well Patient left: in bed;with family/visitor present Nurse Communication: Mobility status         Time: 1520-1540 PT Time Calculation (min) (ACUTE ONLY): 20 min   Charges:   PT Evaluation $Initial PT Evaluation Tier I: 1 Procedure     PT G Codes:        Albertina ParrMargaret E Hopkins, PT, DPT, [phone] 11/28/14  4:17 PM

## 2014-11-28 NOTE — Progress Notes (Signed)
PHARMACY - CRITICAL CARE PROGRESS NOTE  Pharmacy Consult for Electrolyte Management  Indication: ICU Status   Allergies  Allergen Reactions  . Naproxen Other (See Comments)    Reaction:  Blood in stools    Patient Measurements: Height: 6\' 2"  (188 cm) Weight: 225 lb (102.059 kg) IBW/kg (Calculated) : 82.2   Vital Signs: Temp: 98.2 F (36.8 C) (05/24 0400) Temp Source: Oral (05/24 0400) BP: 132/85 mmHg (05/24 0600) Pulse Rate: 57 (05/24 0600) Intake/Output from previous day: 05/23 0701 - 05/24 0700 In: 1167.6 [P.O.:240; I.V.:927.6] Out: 650 [Urine:650] Intake/Output from this shift:   Vent settings for last 24 hours:    Labs:  Recent Labs  11/27/14 0919 11/28/14 0642  WBC 14.1* 15.7*  HGB 17.9 15.5  HCT 53.6* 46.7  PLT 214 158  CREATININE 0.93 0.73  MG  --  2.1  PHOS  --  3.1   Estimated Creatinine Clearance: 137.8 mL/min (by C-G formula based on Cr of 0.73).   Recent Labs  11/27/14 2344 11/28/14 0343 11/28/14 0744  GLUCAP 218* 186* 214*    Microbiology: No results found for this or any previous visit (from the past 720 hour(s)).  Medications:  Scheduled:  . aspirin  81 mg Oral Daily  . atorvastatin  80 mg Oral q1800  . benazepril  10 mg Oral Daily  . carvedilol  3.125 mg Oral BID WC  . insulin aspart  0-24 Units Subcutaneous 6 times per day  . nitroGLYCERIN  0.5 inch Topical 4 times per day  . prasugrel  10 mg Oral Daily  . sodium chloride  3 mL Intravenous Q12H   Infusions:     Assessment: 54 yo male admitted to ICU with ST elevation.    Plan:  Electrolytes are WNL. Glucose elevated.   Will obtain follow-up electrolytes with am labs.   Pharmacy will continue to monitor and adjust per consult.    Ryan Bradshaw,Ryan Bradshaw 11/28/2014,9:16 AM

## 2014-11-28 NOTE — Care Management (Signed)
Discussed discharge planning with Ryan Bradshaw and his wife. Ryan Bradshaw has applied for Medicaid and reports that he was told by Medicaid that he would have to be on disability for 3 months before he could receive Medicaid. He has applied for disability. Today he was provided with a coupon for Effient . Reviewed the following provided materials with Ryan Bradshaw: a generic reuseable drug discount card, applications to [facility] and to [facility], $4.00 [employer] list of meds, list of local clinics where Ryan Bradshaw can be seen by an MD or PA and receive assistance with medications. Wife verbalized understanding to this Clinical research associatewriter while Cardiologist counseled Ryan Bradshaw about alternatives to smoking cigarettes.

## 2014-11-28 NOTE — Progress Notes (Addendum)
Patient: Ryan Bradshaw / Admit Date: 11/27/2014 / Date of Encounter: 11/28/2014, 9:46 AM   Subjective: No events overnight. No further chest pain. Ambulated in his room, mild dizziness upon sitting up on the edge of his bed, none since. Blood sugars improved. Status post PCI of occluded mLCx. Cardiac cath also showed pLAD 30%, dLAD 40%, 1st diag 60%, pRCA 20%, mRCA 40%. Small OM branch which continued to be occlude 2/2 emboliztion. EF 50%. Echo showed EF 45%, GR1DD.   Review of Systems: Review of Systems  Constitutional: Positive for malaise/fatigue. Negative for fever, chills, weight loss and diaphoresis.  HENT: Negative for congestion, hearing loss and sore throat.   Eyes: Negative for pain, discharge and redness.  Respiratory: Negative for cough, hemoptysis, sputum production, shortness of breath and wheezing.   Cardiovascular: Negative for chest pain, palpitations, orthopnea, claudication, leg swelling and PND.  Gastrointestinal: Negative for heartburn, nausea, vomiting, abdominal pain, diarrhea, constipation, blood in stool and melena.  Genitourinary: Negative for dysuria, urgency, frequency, hematuria and flank pain.  Musculoskeletal: Positive for back pain, joint pain and neck pain. Negative for myalgias and falls.  Skin: Negative for itching and rash.  Neurological: Positive for dizziness and weakness. Negative for tingling, tremors, sensory change, speech change, focal weakness, loss of consciousness and headaches.  Psychiatric/Behavioral: Negative for depression and suicidal ideas. The patient is not nervous/anxious.   All other systems reviewed and negative.   Objective: Telemetry: NSR, 80s Physical Exam: Blood pressure 132/85, pulse 57, temperature 98.2 F (36.8 C), temperature source Oral, resp. rate 22, height 6\' 2"  (1.88 m), weight 225 lb (102.059 kg), SpO2 94 %. Body mass index is 28.88 kg/(m^2). General: Well developed, well nourished, in no acute distress. Head:  Normocephalic, atraumatic, sclera non-icteric, no xanthomas, nares are without discharge. Neck: Negative for carotid bruits. JVP not elevated. Lungs: Clear bilaterally to auscultation without wheezes, rales, or rhonchi. Breathing is unlabored. Heart: RRR S1 S2 without murmurs, rubs, or gallops.  Abdomen: Soft, non-tender, non-distended with normoactive bowel sounds. No rebound/guarding. Extremities: No clubbing or cyanosis. No edema. Distal pedal pulses are 2+ and equal bilaterally. Neuro: Alert and oriented X 3. Moves all extremities spontaneously. Psych:  Responds to questions appropriately with a normal affect.   Intake/Output Summary (Last 24 hours) at 11/28/14 0946 Last data filed at 11/27/14 2303  Gross per 24 hour  Intake 1167.6 ml  Output    650 ml  Net  517.6 ml    Inpatient Medications:  . aspirin  81 mg Oral Daily  . atorvastatin  80 mg Oral q1800  . benazepril  10 mg Oral Daily  . carvedilol  3.125 mg Oral BID WC  . insulin aspart  0-24 Units Subcutaneous 6 times per day  . nitroGLYCERIN  0.5 inch Topical 4 times per day  . prasugrel  10 mg Oral Daily  . sodium chloride  3 mL Intravenous Q12H   Infusions:    Labs:  Recent Labs  11/27/14 0919 11/28/14 0642  NA 138 138  K 4.1 3.7  CL 101 104  CO2 24 26  GLUCOSE 332* 189*  BUN 12 9  CREATININE 0.93 0.73  CALCIUM 9.7 9.2  MG  --  2.1  PHOS  --  3.1   No results for input(s): AST, ALT, ALKPHOS, BILITOT, PROT, ALBUMIN in the last 72 hours.  Recent Labs  11/27/14 0919 11/28/14 0642  WBC 14.1* 15.7*  NEUTROABS 8.5*  --   HGB 17.9 15.5  HCT  53.6* 46.7  MCV 88.3 87.7  PLT 214 158    Recent Labs  11/27/14 0919 11/27/14 1334  TROPONINI <0.03 32.86*   Invalid input(s): POCBNP No results for input(s): HGBA1C in the last 72 hours.   Weights: Filed Weights   11/27/14 0900  Weight: 225 lb (102.059 kg)     Radiology/Studies:  Dg Chest Port 1 View  11/27/2014   CLINICAL DATA:  Mid sternal chest  pain starting today, smoker  EXAM: PORTABLE CHEST - 1 VIEW  COMPARISON:  None.  FINDINGS: Cardiomediastinal silhouette is unremarkable. No acute infiltrate or pleural effusion. No pulmonary edema. Bony thorax is unremarkable.  IMPRESSION: No active disease.   Electronically Signed   By: Natasha Mead M.D.   On: 11/27/2014 09:31     Assessment and Plan  54 y.o. male with h/o poorly controlled DM2, HTN, HLD, peripheral neuropathy, GERD, and chronic back and neck pain who presented to [facility] ED this morning at 9 AM with substernal chest pain and was found to have a ST elevateion in leads II, III, aVF V5-V6. He was taken emergently to the cardiac cath lab and was found to have an occluded mLCx s/p PCI/DES.   1. STEMI: -Presented with occluded mid LCx s/p successful PCI/DES, EF 50% by LV gram -Continue DAPT for the next 12 months (aspirin 81 mg and Effient 10 mg daily) -Echo showed EF 45%, GR1DD -Residual coronary disease per HPI to be managed medically  -Lipitor 80 mg  -Coreg 3.125 mg bid -Mild residual chest pain post cath likely 2/2 small OM, resolved this morning  -Transition to 2A -Advance activity as tolerated -Cardiac rehab  2. Uncontrolled DM2: -Hold metformin until 5/25 (48 hours post cath) -Blood sugr improved -SSI -Diabetic educator consult  3. HTN: -Improved -Coreg started as above -Titrate as BP and pulse allow  4. HLD: -Lipitor 80 mg -Check FLP in AM  5. Chronic back pain: -Stable currently  6. Dispo: -Case manager consult to aid in medication management  -Have pharmacy reconcile home medications   Signed, Carola Frost Pager: [phone] 11/28/2014, 9:46 AM    Attending Note Patient seen and examined, agree with detailed note above,  Patient presentation and plan discussed on rounds.   No further chest pain today, slept well, Eating well. Has not ambulated yet.  Tele reviewed. Detailes of the cardiac cath and medical management going forward  discussed in detail with the patient. Answer questions from he and his wife. -Stressed the importance of smoking cessation He does not want nicotine patch or chantix.  Would monitor on tele one more day, ambulate. Asa, effient, b-blocker, ace, statin as above  Signed: Dossie Arbour  M.D., Ph.D. 11/28/14

## 2014-11-29 ENCOUNTER — Other Ambulatory Visit: Payer: Self-pay | Admitting: Physician Assistant

## 2014-11-29 ENCOUNTER — Telehealth: Payer: Self-pay

## 2014-11-29 DIAGNOSIS — I209 Angina pectoris, unspecified: Secondary | ICD-10-CM

## 2014-11-29 DIAGNOSIS — I2510 Atherosclerotic heart disease of native coronary artery without angina pectoris: Secondary | ICD-10-CM

## 2014-11-29 LAB — BASIC METABOLIC PANEL
ANION GAP: 8 (ref 5–15)
BUN: 12 mg/dL (ref 6–20)
CO2: 26 mmol/L (ref 22–32)
Calcium: 9.2 mg/dL (ref 8.9–10.3)
Chloride: 102 mmol/L (ref 101–111)
Creatinine, Ser: 0.75 mg/dL (ref 0.61–1.24)
GFR calc non Af Amer: >60 mL/min (ref >60)
GLUCOSE: 223 mg/dL — AB (ref 65–99)
Potassium: 3.8 mmol/L (ref 3.5–5.1)
Sodium: 136 mmol/L (ref 135–145)

## 2014-11-29 LAB — MAGNESIUM: MAGNESIUM: 2 mg/dL (ref 1.7–2.4)

## 2014-11-29 LAB — PHOSPHORUS: Phosphorus: 3.1 mg/dL (ref 2.5–4.6)

## 2014-11-29 LAB — GLUCOSE, CAPILLARY: GLUCOSE-CAPILLARY: 289 mg/dL — AB (ref 65–99)

## 2014-11-29 MED ORDER — PRASUGREL HCL 10 MG PO TABS: 10.0000 mg | ORAL_TABLET | Freq: Every day | ORAL | Status: DC

## 2014-11-29 MED ORDER — CARVEDILOL 6.25 MG PO TABS: 6.2500 mg | ORAL_TABLET | Freq: Two times a day (BID) | ORAL | Status: DC

## 2014-11-29 MED ORDER — NITROGLYCERIN 0.4 MG SL SUBL: SUBLINGUAL_TABLET | SUBLINGUAL | Status: DC

## 2014-11-29 MED ORDER — METFORMIN HCL 500 MG PO TABS: 500.0000 mg | ORAL_TABLET | Freq: Three times a day (TID) | ORAL | Status: DC

## 2014-11-29 MED ORDER — NITROGLYCERIN 0.4 MG SL SUBL: 0.4000 mg | SUBLINGUAL_TABLET | SUBLINGUAL | Status: DC | PRN

## 2014-11-29 MED ORDER — ATORVASTATIN CALCIUM 80 MG PO TABS: 80.0000 mg | ORAL_TABLET | Freq: Every day | ORAL | Status: AC

## 2014-11-29 MED ORDER — ASPIRIN 81 MG PO CHEW: 81.0000 mg | CHEWABLE_TABLET | Freq: Every day | ORAL | Status: AC

## 2014-11-29 MED ORDER — ASPIRIN 81 MG PO CHEW: 81.0000 mg | CHEWABLE_TABLET | Freq: Every day | ORAL | Status: DC

## 2014-11-29 MED ORDER — ATORVASTATIN CALCIUM 80 MG PO TABS: 80.0000 mg | ORAL_TABLET | Freq: Every day | ORAL | Status: DC

## 2014-11-29 NOTE — Care Management (Signed)
Spoke with patient about discharge meds and having them filled at [facility].  Patient declines this offer.  Provided him coupons for his coreg and lipitor.  Provided patient with patient assistance form for Effient.  He has in had a coupon for free medication.  Strongly encouraged patient to complete [facility] Application.  Wife verbalizes understanding.  Gave him the option of having his cardiologist to complete Effient application if he does not proceed with [facility]. Patient does not appear interested in accepting this responsibility

## 2014-11-29 NOTE — Telephone Encounter (Signed)
Pt wife called, states that pt Nitro refill was not sent ot the pharmacy following D/C at [facility]. She asks if pt needs a rx for this or not. Please call and advise.

## 2014-11-29 NOTE — Discharge Summary (Signed)
Discharge Summary   Patient ID: Ryan Bradshaw MRN: [id number], DOB/AGE: [birth year] 54 y.o. Admit date: 11/27/2014 D/C date:     11/29/2014  Primary Care Provider: No primary care provider on file. Primary Cardiologist: Dr. Fletcher Anon, MD   Primary Discharge Diagnoses:  Acute ST elevation MI with occluded mid LCx s/p successful PCI and DES  Secondary Discharge Diagnoses:  1. Poorly controlled DM2 2. HTN 3. HLD 4. Peripheral neuropathy 5. GERD 6. Chronic neck and back pain  Hospital Course:  54 y.o. male with h/o poorly controlled DM2, HTN, HLD, peripheral neuropathy, GERD, and chronic back and neck pain who presented to [facility] ED on the morning of 5/23 at 9 AM with substernal chest pain and was found to have a ST elevateion in leads II, III, aVF V5-V6. He was taken emergently to the cardiac cath lab and was found to have an occluded mid LCx which was treated successfully with PCI and DES.   Prior to the above the patient did not have any previously known cardiac history with no prior stress tests, or cardiac caths, though he did have risk factors that included poorly controlled DM2, HTN, HLD, and ongoing tobacco abuse. He was previously moderately active, but has since slowed in life 2/2 his chronic neck and back pain. His PCP recently increased his metformin to tid for his poorly controlled DM. He initially told me he did not recall what his last A1C was, however per diabetic educator note 4 months ago his last A1C was 11.0% and 3 weeks ago prior to increasing his metformin his A1C was 8.0%. He last checked his home glucose approximately 2 weeks ago with readings in the >200s. He does not eat a healthy diet and does not get regular exercise. He has smoked tobacco since age 76 at 1.5 packs per day. He has tried patches and "pills" to quit without success, though notes he has not been ready to quit. He denies any history of drinking or illegal drugs.   He reports waking up on the morning of 5/23  with substernal chest pain with associated nausea, diaphoresis, and SOB. He had never had pain like this before. This prompted him to come to the ED. Pain lasted until he got to the ED and received a cardiac cath. Upon his arrival to [facility] ED he was found to have an EKG that showed sinus bradycardia, 55 bpm, ST elevations leads II, III, aVF, V5-V6. Troponin negative x1. Subsequent troponin showed 32.86. CXR with no active disease. WBC 14.1. Glucose 332. He was taken emergently to the cardiac cath lab and was found to have an occluded mid LCx s/p successful PCI/DES. EF 50% by LV gram. He was placed on Angiomax post cath. Post cath he noted mild chest pain in the CCU felt to be 2/2 an occluded small OM branch. He was given nitro ointment which resolved his pain. He did not have any further pain during his admission. He was started on aspirin 81 mg daily and Effient 10 mg daily. He was supplied a coupon card for the Effient. He was advised to not run out of either and to call the office if he has any issues or is running low. He was advised of the risks of not taking DAPT. Echo showed showed an EF of 45%, lateral and distal wall HK, GR1DD, left atrium mildly dilated. His metformin was held for 48 hours post cath. He was advised he can resume that today (5/25). He was also  placed on SSI for his blood sugars which improved prior to discharge. He did not want to be discharged on insulin. The pharmacy was called to get record of his home medications. He ambulated with PT on post cath day one without any issues, chest pain, or SOB. He stated he was at his baseline. PT felt he did not need any further evaluation from them. He did not have any significant events on telemetry. His blood pressure remained stable. His Coreg was up titrated to 6.25 mg bid prior to discharge. He was started on Lipitor 80 mg. His labs remained stable. He ambulated again this morning without any symptoms. He was advised to follow up with his PCP for  his DM2. He was seen by Dr. Rockey Situ this morning and felt to be stable for discharge.     Consults: Diabetic counselor and PT  Discharge Vitals: Blood pressure 106/78, pulse 95, temperature 98 F (36.7 C), temperature source Oral, resp. rate 18, height 6' 2" (1.88 m), weight 225 lb (102.059 kg), SpO2 98 %.  Labs: Lab Results  Component Value Date   WBC 15.7* 11/28/2014   HGB 15.5 11/28/2014   HCT 46.7 11/28/2014   MCV 87.7 11/28/2014   PLT 158 11/28/2014    Recent Labs Lab 11/29/14 0442  NA 136  K 3.8  CL 102  CO2 26  BUN 12  CREATININE 0.75  CALCIUM 9.2  GLUCOSE 223*    Recent Labs  11/27/14 0919 11/27/14 1334  TROPONINI <0.03 32.86*   Lab Results  Component Value Date   CHOL 236* 11/28/2014   HDL 30* 11/28/2014   LDLCALC 148* 11/28/2014   TRIG 291* 11/28/2014    Diagnostic Studies/Procedures   Cardiac cath 11/27/2014:  Coronary Findings    Dominance: Right   Left Main  The vessel is angiographically normal.     Left Anterior Descending   . Prox LAD to Mid LAD lesion, 30% stenosed.   Jorene Minors LAD lesion, 40% stenosed.   . First Diagonal Branch   The vessel is large in size.   . 1st Diag lesion, 60% stenosed.     Ramus Intermedius   . Ost Ramus lesion, 40% stenosed.     Left Circumflex   . Prox Cx to Mid Cx lesion, 100% stenosed. The lesion is type C, thrombotic . The lesion was not previously treated.   Marland Kitchen PCI: There is no pre-interventional antegrade distal flow (TIMI 0). Pre-stent angioplasty was performed. A drug-eluting stent was placed. The strut is apposed. Post-stent angioplasty was performed. Maximum pressure: 14 atm. The post-interventional distal flow is normal (TIMI 3). The intervention was successful. No complications occurred at this lesion.  . Supplies used: [employer] [id number]  . There is no residual stenosis post intervention.     . First Obtuse Marginal Branch   The vessel is small in size.     Right Coronary Artery     . Prox RCA lesion, 20% stenosed.   . Mid RCA lesion, 40% stenosed.   . Right Posterior Descending Artery   There is mild.   . Right Posterior Atrioventricular Branch   The vessel exhibits minimal luminal irregularities.   . First Right Posterolateral   There is mild.   . Second Right Posterolateral   There is mild.     Echo 11/27/2014:  Study Conclusions  - Left ventricle: LVEF is approxiamtely 45% with lateral and distal posterior hypokinesis. The cavity size was normal. Wall thickness was normal.  Doppler parameters are consistent with abnormal left ventricular relaxation (grade 1 diastolic dysfunction). - Left atrium: The atrium was mildly dilated.   Dg Chest Port 1 View  11/27/2014   CLINICAL DATA:  Mid sternal chest pain starting today, smoker  EXAM: PORTABLE CHEST - 1 VIEW  COMPARISON:  None.  FINDINGS: Cardiomediastinal silhouette is unremarkable. No acute infiltrate or pleural effusion. No pulmonary edema. Bony thorax is unremarkable.  IMPRESSION: No active disease.   Electronically Signed   By: Lahoma Crocker M.D.   On: 11/27/2014 09:31    Discharge Medications   Discharge Medication List as of 11/29/2014 10:44 AM    START taking these medications   Details  aspirin 81 MG chewable tablet Chew 1 tablet (81 mg total) by mouth daily., Starting 11/29/2014, Until Discontinued, Normal    carvedilol (COREG) 6.25 MG tablet Take 1 tablet (6.25 mg total) by mouth 2 (two) times daily with a meal., Starting 11/29/2014, Until Discontinued, Normal    nitroGLYCERIN (NITROSTAT) 0.4 MG SL tablet Place 1 tablet (0.4 mg total) under the tongue every 5 (five) minutes as needed for chest pain (Do not give more than 3 SL tablets in 15 minutes.)., Starting 11/29/2014, Until Discontinued, Normal    prasugrel (EFFIENT) 10 MG TABS tablet Take 1 tablet (10 mg total) by mouth daily., Starting 11/29/2014, Until Discontinued, Normal      CONTINUE these medications which have CHANGED   Details   atorvastatin (LIPITOR) 80 MG tablet Take 1 tablet (80 mg total) by mouth daily at 6 PM., Starting 11/29/2014, Until Discontinued, Normal    metFORMIN (GLUCOPHAGE) 500 MG tablet Take 1 tablet (500 mg total) by mouth 3 (three) times daily., Starting 11/29/2014, Until Discontinued, Normal      CONTINUE these medications which have NOT CHANGED   Details  ALPRAZolam (XANAX) 0.5 MG tablet Take 0.5 mg by mouth 2 (two) times daily as needed for anxiety., Until Discontinued, Historical Med    gabapentin (NEURONTIN) 300 MG capsule Take 300 mg by mouth daily., Until Discontinued, Historical Med    lisinopril (PRINIVIL,ZESTRIL) 10 MG tablet Take 10 mg by mouth daily., Until Discontinued, Historical Med      STOP taking these medications     atenolol (TENORMIN) 25 MG tablet         Disposition   The patient will be discharged in stable condition to home.  Follow-up Information    Follow up with Kathlyn Sacramento, MD On 12/14/2014.   Specialty:  Cardiology   Why:  Appointment time: 2:45 PM   Contact information:   [address], [place] [address] [phone]         Duration of Discharge Encounter: Greater than 30 minutes including physician and PA time.  Melvern Banker, PA-C Pager: [phone] 11/29/2014, 12:28 PM

## 2014-11-29 NOTE — Progress Notes (Addendum)
Patient: Ryan Bradshaw / Admit Date: 11/27/2014 / Date of Encounter: 11/29/2014, 8:00 AM   Subjective: Feels well this morning. Has ambulated with PT and is at his baseline - no further follow up needed. No chest pain or SOB. No palpitations.   Review of Systems: Review of Systems  Constitutional: Negative for fever, chills, weight loss, malaise/fatigue and diaphoresis.  HENT: Negative for congestion and sore throat.   Eyes: Negative for discharge and redness.  Respiratory: Negative for cough, hemoptysis, sputum production, shortness of breath and wheezing.   Cardiovascular: Negative for chest pain, palpitations, orthopnea, claudication, leg swelling and PND.  Gastrointestinal: Negative for heartburn, nausea, vomiting, blood in stool and melena.  Musculoskeletal: Positive for myalgias, back pain and neck pain. Negative for falls.  Skin: Negative for itching and rash.  Neurological: Negative for dizziness, speech change and weakness.  Psychiatric/Behavioral: Negative for hallucinations. The patient is not nervous/anxious.   All other systems reviewed and negative.   Objective: Telemetry: NSR, 80's Physical Exam: Blood pressure 142/94, pulse 78, temperature 97.9 F (36.6 C), temperature source Oral, resp. rate 20, height  (1.88 m), weight 225 lb (102.059 kg), SpO2 94 %. Body mass index is 28.88 kg/(m^2). General: Well developed, well nourished, in no acute distress. Head: Normocephalic, atraumatic, sclera non-icteric, no xanthomas, nares are without discharge. Neck: Negative for carotid bruits. JVP not elevated. Lungs: Clear bilaterally to auscultation without wheezes, rales, or rhonchi. Breathing is unlabored. Heart: RRR S1 S2 without murmurs, rubs, or gallops.  Abdomen: Soft, non-tender, non-distended with normoactive bowel sounds. No rebound/guarding. Extremities: No clubbing or cyanosis. No edema. Cath site without bleeding, bruising, swelling, or TTP. 2+ pulse.  Neuro:  Alert and oriented X 3. Moves all extremities spontaneously. Psych:  Responds to questions appropriately with a normal affect.  No intake or output data in the 24 hours ending 11/29/14 0800  Inpatient Medications:  . aspirin  81 mg Oral Daily  . atorvastatin  80 mg Oral q1800  . benazepril  10 mg Oral Daily  . carvedilol  3.125 mg Oral BID WC  . gabapentin  300 mg Oral Daily  . insulin aspart  0-20 Units Subcutaneous TID WC  . insulin aspart  0-5 Units Subcutaneous QHS  . insulin glargine  15 Units Subcutaneous Daily  . prasugrel  10 mg Oral Daily  . sodium chloride  3 mL Intravenous Q12H   Infusions:    Labs:  Recent Labs  11/28/14 0642 11/29/14 0442  NA 138 136  K 3.7 3.8  CL 104 102  CO2 26 26  GLUCOSE 189* 223*  BUN 9 12  CREATININE 0.73 0.75  CALCIUM 9.2 9.2  MG 2.1 2.0  PHOS 3.1 3.1   No results for input(s): AST, ALT, ALKPHOS, BILITOT, PROT, ALBUMIN in the last 72 hours.  Recent Labs  11/27/14 0919 11/28/14 0642  WBC 14.1* 15.7*  NEUTROABS 8.5*  --   HGB 17.9 15.5  HCT 53.6* 46.7  MCV 88.3 87.7  PLT 214 158    Recent Labs  11/27/14 0919 11/27/14 1334  TROPONINI <0.03 32.86*   Invalid input(s): POCBNP No results for input(s): HGBA1C in the last 72 hours.   Weights: Filed Weights   11/27/14 0900  Weight: 225 lb (102.059 kg)     Radiology/Studies:  Dg Chest Port 1 View  11/27/2014   CLINICAL DATA:  Mid sternal chest pain starting today, smoker  EXAM: PORTABLE CHEST - 1 VIEW  COMPARISON:  None.  FINDINGS:  Cardiomediastinal silhouette is unremarkable. No acute infiltrate or pleural effusion. No pulmonary edema. Bony thorax is unremarkable.  IMPRESSION: No active disease.   Electronically Signed   By: Natasha MeadLiviu  Pop M.D.   On: 11/27/2014 09:31     Assessment and Plan  54 y.o. male with h/o poorly controlled DM2, HTN, HLD, peripheral neuropathy, GERD, and chronic back and neck pain who [facility] ED this morning at 9 AM with substernal chest  pain and was found to have a ST elevateion in leads II, III, aVF V5-V6. He was taken emergently to the cardiac cath lab and was found to have an occluded mLCx s/p PCI/DES.   1. STEMI: -Presented with occluded mid LCx s/p successful PCI/DES, EF 50% by LV gram -Continue DAPT for the next 12 months (aspirin 81 mg and Effient 10 mg daily) -Echo showed EF 45%, GR1DD -Residual coronary disease per HPI to be managed medically  -Lipitor 80 mg  -Increase Coreg to 6.25 mg bid -Mild residual chest pain post cath likely 2/2 small OM, resolved this morning  -Transition to 2A -Advance activity as tolerated -Cardiac rehab -Post cath instructions covered in detail   2. Uncontrolled DM2: -Hold metformin until 5/25 (48 hours post cath) -Blood sugr improved -SSI -Diabetic educator consult  3. HTN: -Improved -Increase Coreg as above -Continue benazepril  -Titrate as BP and pulse allow   4. HLD: -Lipitor 80 mg -Check FLP in AM  5. Chronic back pain: -Stable currently  6. Dispo: -Case manager consult to aid in medication management  -Have pharmacy reconcile home medications -Stable for discharge once MD sees    Signed, Eula ListenRyan Dunn, PA-C Pager: [phone] 11/29/2014, 8:00 AM   Attending Note Patient seen and examined, agree with detailed note above,  Patient presentation and plan discussed on rounds.   No chest pain or SOB, He has ambulated with no sx. Smoking cessation discussed again with him, family present Medications as detailed above   Signed: Dossie Arbourim Tyona Nilsen  M.D., Ph.D. 11/29/14

## 2014-11-29 NOTE — Telephone Encounter (Signed)
Ryan sent this in today.

## 2014-11-29 NOTE — Progress Notes (Signed)
Discharge instructions explained to pt/ verbalized an understanding / iv and tele removed/ transported off unit via wheelchair.  

## 2014-12-07 ENCOUNTER — Ambulatory Visit: Payer: Self-pay

## 2014-12-12 ENCOUNTER — Ambulatory Visit: Payer: Self-pay

## 2014-12-14 ENCOUNTER — Encounter: Payer: Self-pay | Admitting: Cardiovascular Disease

## 2014-12-14 ENCOUNTER — Ambulatory Visit (INDEPENDENT_AMBULATORY_CARE_PROVIDER_SITE_OTHER): Payer: Medicaid Other | Admitting: Cardiovascular Disease

## 2014-12-14 VITALS — BP 118/82 | HR 81 | Ht 73.0 in | Wt 193.0 lb

## 2014-12-14 DIAGNOSIS — I2510 Atherosclerotic heart disease of native coronary artery without angina pectoris: Secondary | ICD-10-CM | POA: Insufficient documentation

## 2014-12-14 DIAGNOSIS — I2119 ST elevation (STEMI) myocardial infarction involving other coronary artery of inferior wall: Secondary | ICD-10-CM

## 2014-12-14 DIAGNOSIS — E785 Hyperlipidemia, unspecified: Secondary | ICD-10-CM

## 2014-12-14 MED ORDER — CLOPIDOGREL BISULFATE 75 MG PO TABS: 75.0000 mg | ORAL_TABLET | Freq: Every day | ORAL | Status: DC

## 2014-12-14 MED ORDER — NITROGLYCERIN 0.4 MG SL SUBL: SUBLINGUAL_TABLET | SUBLINGUAL | Status: DC

## 2014-12-14 NOTE — Patient Instructions (Signed)
Medication Instructions:  Your physician has recommended you make the following change in your medication:  1) STOP taking Effient after you finish what you have at home 2) START taking Plavix 75mg  once per day AFTER you stop taking Effient 2) START taking nitrostat as needed for chest pain   Labwork: Your physician recommends that you return for a FASTING lipid and liver profile in one month. Nothing to eat or drink after midnight the evening before your lab   Testing/Procedures: none  Follow-Up: Your physician recommends that you schedule a follow-up appointment in: three months with Dr. Kirke Corin.    Any Other Special Instructions Will Be Listed Below (If Applicable).

## 2014-12-14 NOTE — Assessment & Plan Note (Signed)
He is doing well overall after recent inferior myocardial infarction treated successfully with angioplasty and drug-eluting stent placement. I recommend continuing dual antiplatelets therapy for at least one year. He is not able to afford Effient. Thus, I switched him to generic Plavix 75 mg once daily. I discussed with him the importance of healthy lifestyle changes in controlling his risk factors.  I congratulated him on smoking cessation

## 2014-12-14 NOTE — Progress Notes (Signed)
Primary care physician: Dr. Arlana Pouch  HPI  This is a 54 year old male who is here today for follow-up visit after recent inferoposterior ST elevation myocardial infarction. He has known history of poorly controlled type 2 diabetes, hypertension, hyperlipidemia, peripheral neuropathy, GERD and chronic back pain. He presented to [facility] emergency room last month with substernal chest pain. He was found to have inferior ST elevation. I proceeded with emergent cardiac catheterization via the right radial artery which showed an occluded mid left circumflex with ejection fraction of 50%. He underwent successful angioplasty and drug-eluting stent placement to the mid left circumflex. There was an occluded small distal OM branch which was left to be treated medically. Echocardiogram showed an ejection fraction of 45% with grade 1 diastolic dysfunction. He has been doing well since hospital discharge and denies any chest pain or shortness of breath. He quit smoking since his myocardial infarction. His biggest issue seems to be related to peripheral neuropathy. His distal pulses are palpable.   Allergies  Allergen Reactions  . Naproxen Other (See Comments)    Reaction:  Blood in stools     Current Outpatient Prescriptions on File Prior to Visit  Medication Sig Dispense Refill  . ALPRAZolam (XANAX) 0.5 MG tablet Take 0.5 mg by mouth 2 (two) times daily as needed for anxiety.    Marland Kitchen aspirin 81 MG chewable tablet Chew 1 tablet (81 mg total) by mouth daily. 30 tablet 11  . atorvastatin (LIPITOR) 80 MG tablet Take 1 tablet (80 mg total) by mouth daily at 6 PM. 30 tablet 11  . carvedilol (COREG) 6.25 MG tablet Take 1 tablet (6.25 mg total) by mouth 2 (two) times daily with a meal. 60 tablet 11  . gabapentin (NEURONTIN) 300 MG capsule Take 300 mg by mouth daily.    Marland Kitchen lisinopril (PRINIVIL,ZESTRIL) 10 MG tablet Take 10 mg by mouth daily.    . metFORMIN (GLUCOPHAGE) 500 MG tablet Take 1 tablet (500 mg total) by mouth 3  (three) times daily. 90 tablet 11   No current facility-administered medications on file prior to visit.     Past Medical History  Diagnosis Date  . DM2 (diabetes mellitus, type 2)   . Hypertension   . Neuropathy   . GERD (gastroesophageal reflux disease)   . HLD (hyperlipidemia)      Past Surgical History  Procedure Laterality Date  . Cardiac catheterization N/A 11/27/2014    Procedure: Left Heart Cath and Coronary Angiography;  Surgeon: Iran Ouch, MD;  Location: [facility] INVASIVE CV LAB;  Service: Cardiovascular;  Laterality: N/A;  . Cardiac catheterization N/A 11/27/2014    Procedure: Coronary Stent Intervention;  Surgeon: Iran Ouch, MD;  Location: [facility] INVASIVE CV LAB;  Service: Cardiovascular;  Laterality: N/A;     Family History  Problem Relation Age of Onset  . Hypertension Father   . Diabetes Father   . Hypertension Sister   . Hypertension Brother   . Hyperlipidemia Brother      History   Social History  . Marital Status: Married    Spouse Name: N/A  . Number of Children: N/A  . Years of Education: N/A   Occupational History  . Not on file.   Social History Main Topics  . Smoking status: Former Smoker -- 1.00 packs/day    Types: Cigarettes    Quit date: 12/06/2014  . Smokeless tobacco: Never Used  . Alcohol Use: No  . Drug Use: No  . Sexual Activity: Yes   Other Topics  Concern  . Not on file   Social History Narrative      PHYSICAL EXAM   BP 118/82 mmHg  Pulse 81  Ht 6\' 1"  (1.854 m)  Wt 193 lb (87.544 kg)  BMI 25.47 kg/m2 Constitutional: He is oriented to person, place, and time. He appears well-developed and well-nourished. No distress.  HENT: No nasal discharge.  Head: Normocephalic and atraumatic.  Eyes: Pupils are equal and round.  No discharge. Neck: Normal range of motion. Neck supple. No JVD present. No thyromegaly present.  Cardiovascular: Normal rate, regular rhythm, normal heart sounds. Exam reveals no gallop and no  friction rub. No murmur heard.  Pulmonary/Chest: Effort normal and breath sounds normal. No stridor. No respiratory distress. He has no wheezes. He has no rales. He exhibits no tenderness.  Abdominal: Soft. Bowel sounds are normal. He exhibits no distension. There is no tenderness. There is no rebound and no guarding.  Musculoskeletal: Normal range of motion. He exhibits no edema and no tenderness.  Neurological: He is alert and oriented to person, place, and time. Coordination normal.  Skin: Skin is warm and dry. No rash noted. He is not diaphoretic. No erythema. No pallor.  Psychiatric: He has a normal mood and affect. His behavior is normal. Judgment and thought content normal.  Right radial pulse is normal with no hematoma. Distal pulses are palpable.     TSV:[id number]  Rhythm  -  T-abnormality  -Possible  Anterolateral and inferior  ischemia.   ABNORMAL    ASSESSMENT AND PLAN

## 2014-12-14 NOTE — Assessment & Plan Note (Signed)
Continue high dose atorvastatin. I requested fasting lipid and liver profile in one month. He denies myalgia.

## 2014-12-26 ENCOUNTER — Ambulatory Visit: Payer: Self-pay

## 2014-12-28 ENCOUNTER — Encounter: Payer: Self-pay | Admitting: Pharmacist

## 2015-01-01 ENCOUNTER — Ambulatory Visit: Payer: Self-pay

## 2015-01-01 ENCOUNTER — Telehealth: Payer: Self-pay

## 2015-01-01 NOTE — Telephone Encounter (Signed)
Request from Disability Determination Services , sent to [employer] on 01/02/2015 .  

## 2015-01-12 ENCOUNTER — Other Ambulatory Visit (INDEPENDENT_AMBULATORY_CARE_PROVIDER_SITE_OTHER): Payer: Medicaid Other | Admitting: *Deleted

## 2015-01-12 DIAGNOSIS — I2510 Atherosclerotic heart disease of native coronary artery without angina pectoris: Secondary | ICD-10-CM

## 2015-01-13 LAB — LIPID PANEL
Chol/HDL Ratio: 4.9 ratio units (ref 0.0–5.0)
Cholesterol, Total: 103 mg/dL (ref 100–199)
HDL: 21 mg/dL — ABNORMAL LOW (ref >39)
LDL Calculated: 51 mg/dL (ref 0–99)
Triglycerides: 156 mg/dL — ABNORMAL HIGH (ref 0–149)
VLDL Cholesterol Cal: 31 mg/dL (ref 5–40)

## 2015-01-13 LAB — HEPATIC FUNCTION PANEL
ALBUMIN: 4.3 g/dL (ref 3.5–5.5)
ALT: 38 IU/L (ref 0–44)
AST: 26 IU/L (ref 0–40)
Alkaline Phosphatase: 75 IU/L (ref 39–117)
BILIRUBIN TOTAL: 0.5 mg/dL (ref 0.0–1.2)
Bilirubin, Direct: 0.13 mg/dL (ref 0.00–0.40)
TOTAL PROTEIN: 6.4 g/dL (ref 6.0–8.5)

## 2015-01-22 ENCOUNTER — Telehealth: Payer: Self-pay

## 2015-01-22 NOTE — Telephone Encounter (Signed)
2nd Request from Disabilty Determination Services , sent to [employer] on 01/23/2015 .

## 2015-03-16 ENCOUNTER — Ambulatory Visit: Payer: Self-pay | Admitting: Cardiovascular Disease

## 2015-03-29 ENCOUNTER — Encounter: Payer: Self-pay | Admitting: Cardiovascular Disease

## 2015-03-29 ENCOUNTER — Ambulatory Visit (INDEPENDENT_AMBULATORY_CARE_PROVIDER_SITE_OTHER): Payer: Medicaid Other | Admitting: Cardiovascular Disease

## 2015-03-29 VITALS — BP 122/84 | HR 75 | Ht 73.0 in | Wt 188.2 lb

## 2015-03-29 DIAGNOSIS — I2510 Atherosclerotic heart disease of native coronary artery without angina pectoris: Secondary | ICD-10-CM | POA: Diagnosis not present

## 2015-03-29 DIAGNOSIS — Z720 Tobacco use: Secondary | ICD-10-CM | POA: Diagnosis not present

## 2015-03-29 DIAGNOSIS — R079 Chest pain, unspecified: Secondary | ICD-10-CM

## 2015-03-29 DIAGNOSIS — E785 Hyperlipidemia, unspecified: Secondary | ICD-10-CM

## 2015-03-29 NOTE — Assessment & Plan Note (Signed)
Lab Results  Component Value Date   CHOL 103 01/12/2015   HDL 21* 01/12/2015   LDLCALC 51 01/12/2015   TRIG 156* 01/12/2015   CHOLHDL 4.9 01/12/2015   His LDL was significantly better. Continue high dose atorvastatin.

## 2015-03-29 NOTE — Patient Instructions (Signed)
Medication Instructions: Continue same medications.   Labwork: None.   Procedures/Testing: None.   Follow-Up: 6 months with Dr. Arida.   Any Additional Special Instructions Will Be Listed Below (If Applicable).   

## 2015-03-29 NOTE — Assessment & Plan Note (Signed)
I again discussed with him the importance of smoking cessation. 

## 2015-03-29 NOTE — Progress Notes (Signed)
Primary care physician: Dr. Arlana Pouch  HPI  This is a 54 year old male who is here today for follow-up visit regarding coronary artery disease. He has known history of poorly controlled type 2 diabetes, hypertension, hyperlipidemia, peripheral neuropathy, GERD and chronic back pain. He presented to [facility] in May of this year with substernal chest pain. He was found to have inferior ST elevation. I proceeded with emergent cardiac catheterization which showed an occluded mid left circumflex with ejection fraction of 50%. He underwent successful angioplasty and drug-eluting stent placement to the mid left circumflex. There was an occluded small distal OM branch which was left to be treated medically. Echocardiogram showed an ejection fraction of 45% with grade 1 diastolic dysfunction.  He has occasional twinges in the left side of the chest lasting for a few seconds. Rarely he has substernal tightness with activities. Otherwise he has been doing reasonably well. He continues to work on diabetes control. Unfortunately, he resumed smoking 4-5 cigarettes a day.  Allergies  Allergen Reactions  . Naproxen Other (See Comments)    Reaction:  Blood in stools     Current Outpatient Prescriptions on File Prior to Visit  Medication Sig Dispense Refill  . ALPRAZolam (XANAX) 0.5 MG tablet Take 0.5 mg by mouth 2 (two) times daily as needed for anxiety.    Marland Kitchen aspirin 81 MG chewable tablet Chew 1 tablet (81 mg total) by mouth daily. 30 tablet 11  . atorvastatin (LIPITOR) 80 MG tablet Take 1 tablet (80 mg total) by mouth daily at 6 PM. 30 tablet 11  . carvedilol (COREG) 6.25 MG tablet Take 1 tablet (6.25 mg total) by mouth 2 (two) times daily with a meal. 60 tablet 11  . clopidogrel (PLAVIX) 75 MG tablet Take 1 tablet (75 mg total) by mouth daily. 30 tablet 5  . gabapentin (NEURONTIN) 300 MG capsule Take 300 mg by mouth daily.    Marland Kitchen lisinopril (PRINIVIL,ZESTRIL) 10 MG tablet Take 10 mg by mouth daily.    . metFORMIN  (GLUCOPHAGE) 500 MG tablet Take 1 tablet (500 mg total) by mouth 3 (three) times daily. 90 tablet 11  . nitroGLYCERIN (NITROSTAT) 0.4 MG SL tablet Place 1 tab under tongue every 5 min prn chest pain, not to exceed 3 in 15 min 25 tablet 0   No current facility-administered medications on file prior to visit.     Past Medical History  Diagnosis Date  . DM2 (diabetes mellitus, type 2)   . Hypertension   . Neuropathy   . GERD (gastroesophageal reflux disease)   . HLD (hyperlipidemia)      Past Surgical History  Procedure Laterality Date  . Cardiac catheterization N/A 11/27/2014    Procedure: Left Heart Cath and Coronary Angiography;  Surgeon: Iran Ouch, MD;  Location: [facility] INVASIVE CV LAB;  Service: Cardiovascular;  Laterality: N/A;  . Cardiac catheterization N/A 11/27/2014    Procedure: Coronary Stent Intervention;  Surgeon: Iran Ouch, MD;  Location: [facility] INVASIVE CV LAB;  Service: Cardiovascular;  Laterality: N/A;     Family History  Problem Relation Age of Onset  . Hypertension Father   . Diabetes Father   . Hypertension Sister   . Hypertension Brother   . Hyperlipidemia Brother      Social History   Social History  . Marital Status: Married    Spouse Name: N/A  . Number of Children: N/A  . Years of Education: N/A   Occupational History  . Not on file.   Social  History Main Topics  . Smoking status: Former Smoker -- 1.00 packs/day    Types: Cigarettes    Quit date: 12/06/2014  . Smokeless tobacco: Never Used  . Alcohol Use: No  . Drug Use: No  . Sexual Activity: Yes   Other Topics Concern  . Not on file   Social History Narrative      PHYSICAL EXAM   BP 122/84 mmHg  Pulse 75  Ht  (1.854 m)  Wt 188 lb 4 oz (85.39 kg)  BMI 24.84 kg/m2 Constitutional: He is oriented to person, place, and time. He appears well-developed and well-nourished. No distress.  HENT: No nasal discharge.  Head: Normocephalic and atraumatic.  Eyes: Pupils  are equal and round.  No discharge. Neck: Normal range of motion. Neck supple. No JVD present. No thyromegaly present.  Cardiovascular: Normal rate, regular rhythm, normal heart sounds. Exam reveals no gallop and no friction rub. No murmur heard.  Pulmonary/Chest: Effort normal and breath sounds normal. No stridor. No respiratory distress. He has no wheezes. He has no rales. He exhibits no tenderness.  Abdominal: Soft. Bowel sounds are normal. He exhibits no distension. There is no tenderness. There is no rebound and no guarding.  Musculoskeletal: Normal range of motion. He exhibits no edema and no tenderness.  Neurological: He is alert and oriented to person, place, and time. Coordination normal.  Skin: Skin is warm and dry. No rash noted. He is not diaphoretic. No erythema. No pallor.  Psychiatric: He has a normal mood and affect. His behavior is normal. Judgment and thought content normal.       EKG: Normal sinus rhythm with nonspecific T wave changes.   ASSESSMENT AND PLAN

## 2015-03-29 NOTE — Assessment & Plan Note (Signed)
He seems to be stable overall from a cardiac standpoint. I recommend continued medical therapy.

## 2015-09-27 ENCOUNTER — Encounter: Payer: Self-pay | Admitting: Cardiovascular Disease

## 2015-09-27 ENCOUNTER — Ambulatory Visit (INDEPENDENT_AMBULATORY_CARE_PROVIDER_SITE_OTHER): Payer: Medicaid Other | Admitting: Cardiovascular Disease

## 2015-09-27 VITALS — BP 106/70 | HR 79 | Ht 74.0 in | Wt 192.2 lb

## 2015-09-27 DIAGNOSIS — R079 Chest pain, unspecified: Secondary | ICD-10-CM | POA: Diagnosis not present

## 2015-09-27 DIAGNOSIS — I2510 Atherosclerotic heart disease of native coronary artery without angina pectoris: Secondary | ICD-10-CM | POA: Diagnosis not present

## 2015-09-27 DIAGNOSIS — M79606 Pain in leg, unspecified: Secondary | ICD-10-CM | POA: Diagnosis not present

## 2015-09-27 NOTE — Patient Instructions (Addendum)
Medication Instructions:  Your physician recommends that you continue on your current medications as directed. Please refer to the Current Medication list given to you today.   Labwork: none  Testing/Procedures: Your physician has requested that you have a lexiscan myoview. For further information please visit [email or web address] Please follow instruction sheet, as given.  [facility] MYOVIEW  Your caregiver has ordered a Stress Test with nuclear imaging. The purpose of this test is to evaluate the blood supply to your heart muscle. This procedure is referred to as a "Non-Invasive Stress Test." This is because other than having an IV started in your vein, nothing is inserted or "invades" your body. Cardiac stress tests are done to find areas of poor blood flow to the heart by determining the extent of coronary artery disease (CAD). Some patients exercise on a treadmill, which naturally increases the blood flow to your heart, while others who are  unable to walk on a treadmill due to physical limitations have a pharmacologic/chemical stress agent called Lexiscan . This medicine will mimic walking on a treadmill by temporarily increasing your coronary blood flow.   Please note: these test may take anywhere between 2-4 hours to complete  PLEASE REPORT TO [facility] MEDICAL MALL ENTRANCE  THE VOLUNTEERS AT THE FIRST DESK WILL DIRECT YOU WHERE TO GO  Date of Procedure: Thursday, March 30 Arrival Time for Procedure: 7:15am  Instructions regarding medication:   __xx__ : Hold diabetes medication morning of procedure: Do not take metformin or glipizide the morning of your test  __xx__:  Hold coreg night before procedure and morning of procedure    PLEASE NOTIFY THE OFFICE AT LEAST 24 HOURS IN ADVANCE IF YOU ARE UNABLE TO KEEP YOUR APPOINTMENT.  [phone] AND  PLEASE NOTIFY NUCLEAR MEDICINE AT [facility] AT LEAST 24 HOURS IN ADVANCE IF YOU ARE UNABLE TO KEEP YOUR APPOINTMENT. [phone]  How to prepare  for your Myoview test:   Do not eat or drink after midnight  No caffeine for 24 hours prior to test  No smoking 24 hours prior to test.  Your medication may be taken with water.  If your doctor stopped a medication because of this test, do not take that medication.  Ladies, please do not wear dresses.  Skirts or pants are appropriate. Please wear a short sleeve shirt.  No perfume, cologne or lotion.  Wear comfortable walking shoes. No heels!          Your physician has requested that you have a lower or upper extremity arterial duplex. This test is an ultrasound of the arteries in the legs or arms. It looks at arterial blood flow in the legs and arms. Allow one hour for Lower and Upper Arterial scans. There are no restrictions or special instructions   Follow-Up: Your physician wants you to follow-up in: six months with Dr. Kirke Corin. You will receive a reminder letter in the mail two months in advance. If you don't receive a letter, please call our office to schedule the follow-up appointment.   Any Other Special Instructions Will Be Listed Below (If Applicable).     If you need a refill on your cardiac medications before your next appointment, please call your pharmacy.  Cardiac Nuclear Scanning A cardiac nuclear scan is used to check your heart for problems, such as the following:  A portion of the heart is not getting enough blood.  Part of the heart muscle has died, which happens with a heart attack.  The heart wall is not  working normally.  In this test, a radioactive dye (tracer) is injected into your bloodstream. After the tracer has traveled to your heart, a scanning device is used to measure how much of the tracer is absorbed by or distributed to various areas of your heart. LET [facility] CARE PROVIDER KNOW ABOUT:  Any allergies you have.  All medicines you are taking, including vitamins, herbs, eye drops, creams, and over-the-counter medicines.  Previous  problems you or members of your family have had with the use of anesthetics.  Any blood disorders you have.  Previous surgeries you have had.  Medical conditions you have.  RISKS AND COMPLICATIONS Generally, this is a safe procedure. However, as with any procedure, problems can occur. Possible problems include:   Serious chest pain.  Rapid heartbeat.  Sensation of warmth in your chest. This usually passes quickly. BEFORE THE PROCEDURE Ask your health care provider about changing or stopping your regular medicines. PROCEDURE This procedure is usually done at a hospital and takes 2-4 hours.  An IV tube is inserted into one of your veins.  Your health care provider will inject a small amount of radioactive tracer through the tube.  You will then wait for 20-40 minutes while the tracer travels through your bloodstream.  You will lie down on an exam table so images of your heart can be taken. Images will be taken for about 15-20 minutes.  You will exercise on a treadmill or stationary bike. While you exercise, your heart activity will be monitored with an electrocardiogram (ECG), and your blood pressure will be checked.  If you are unable to exercise, you may be given a medicine to make your heart beat faster.  When blood flow to your heart has peaked, tracer will again be injected through the IV tube.  After 20-40 minutes, you will get back on the exam table and have more images taken of your heart.  When the procedure is over, your IV tube will be removed. AFTER THE PROCEDURE  You will likely be able to leave shortly after the test. Unless your health care provider tells you otherwise, you may return to your normal schedule, including diet, activities, and medicines.  Make sure you find out how and when you will get your test results.   This information is not intended to replace advice given to you by your health care provider. Make sure you discuss any questions you have  with your health care provider.   Document Released: 07/18/2004 Document Revised: 06/28/2013 Document Reviewed: 06/01/2013 Elsevier Interactive Patient Education 2016 [employer]. Smoking Cessation, Tips for Success If you are ready to quit smoking, congratulations! You have chosen to help yourself be healthier. Cigarettes bring nicotine, tar, carbon monoxide, and other irritants into your body. Your lungs, heart, and blood vessels will be able to work better without these poisons. There are many different ways to quit smoking. Nicotine gum, nicotine patches, a nicotine inhaler, or nicotine nasal spray can help with physical craving. Hypnosis, support groups, and medicines help break the habit of smoking. WHAT THINGS CAN I DO TO MAKE QUITTING EASIER?  Here are some tips to help you quit for good:  Pick a date when you will quit smoking completely. Tell all of your friends and family about your plan to quit on that date.  Do not try to slowly cut down on the number of cigarettes you are smoking. Pick a quit date and quit smoking completely starting on that day.  Throw away  all cigarettes.   Clean and remove all ashtrays from your home, work, and car.  On a card, write down your reasons for quitting. Carry the card with you and read it when you get the urge to smoke.  Cleanse your body of nicotine. Drink enough water and fluids to keep your urine clear or pale yellow. Do this after quitting to flush the nicotine from your body.  Learn to predict your moods. Do not let a bad situation be your excuse to have a cigarette. Some situations in your life might tempt you into wanting a cigarette.  Never have "just one" cigarette. It leads to wanting another and another. Remind yourself of your decision to quit.  Change habits associated with smoking. If you smoked while driving or when feeling stressed, try other activities to replace smoking. Stand up when drinking your coffee. Brush your teeth  after eating. Sit in a different chair when you read the paper. Avoid alcohol while trying to quit, and try to drink fewer caffeinated beverages. Alcohol and caffeine may urge you to smoke.  Avoid foods and drinks that can trigger a desire to smoke, such as sugary or spicy foods and alcohol.  Ask people who smoke not to smoke around you.  Have something planned to do right after eating or having a cup of coffee. For example, plan to take a walk or exercise.  Try a relaxation exercise to calm you down and decrease your stress. Remember, you may be tense and nervous for the first 2 weeks after you quit, but this will pass.  Find new activities to keep your hands busy. Play with a pen, coin, or rubber band. Doodle or draw things on paper.  Brush your teeth right after eating. This will help cut down on the craving for the taste of tobacco after meals. You can also try mouthwash.   Use oral substitutes in place of cigarettes. Try using lemon drops, carrots, cinnamon sticks, or chewing gum. Keep them handy so they are available when you have the urge to smoke.  When you have the urge to smoke, try deep breathing.  Designate your home as a nonsmoking area.  If you are a heavy smoker, ask your health care provider about a prescription for nicotine chewing gum. It can ease your withdrawal from nicotine.  Reward yourself. Set aside the cigarette money you save and buy yourself something nice.  Look for support from others. Join a support group or smoking cessation program. Ask someone at home or at work to help you with your plan to quit smoking.  Always ask yourself, "Do I need this cigarette or is this just a reflex?" Tell yourself, "Today, I choose not to smoke," or "I do not want to smoke." You are reminding yourself of your decision to quit.  Do not replace cigarette smoking with electronic cigarettes (commonly called e-cigarettes). The safety of e-cigarettes is unknown, and some may contain  harmful chemicals.  If you relapse, do not give up! Plan ahead and think about what you will do the next time you get the urge to smoke. HOW WILL I FEEL WHEN I QUIT SMOKING? You may have symptoms of withdrawal because your body is used to nicotine (the addictive substance in cigarettes). You may crave cigarettes, be irritable, feel very hungry, cough often, get headaches, or have difficulty concentrating. The withdrawal symptoms are only temporary. They are strongest when you first quit but will go away within 10-14 days. When withdrawal symptoms  occur, stay in control. Think about your reasons for quitting. Remind yourself that these are signs that your body is healing and getting used to being without cigarettes. Remember that withdrawal symptoms are easier to treat than the major diseases that smoking can cause.  Even after the withdrawal is over, expect periodic urges to smoke. However, these cravings are generally short lived and will go away whether you smoke or not. Do not smoke! WHAT RESOURCES ARE AVAILABLE TO HELP ME QUIT SMOKING? Your health care provider can direct you to community resources or hospitals for support, which may include:  Group support.  Education.  Hypnosis.  Therapy.   This information is not intended to replace advice given to you by your health care provider. Make sure you discuss any questions you have with your health care provider.   Document Released: 03/21/2004 Document Revised: 07/14/2014 Document Reviewed: 12/09/2012 Elsevier Interactive Patient Education [employer] Elsevier Inc.

## 2015-09-27 NOTE — Progress Notes (Signed)
Cardiology Office Note   Date:  09/27/2015   ID:  Ryan Bradshaw, DOB 03/03/1961, MRN [id number]  PCP:  Jaclyn ShaggyATE,DENNY C, MD  Cardiologist:   Lorine BearsMuhammad Rashaad Hallstrom, MD   Chief Complaint  Patient presents with  . Other    6 month f/u c/o chest pain pt has taken nitro. Meds reviewed verbally with pt.      History of Present Illness: Ryan Bradshaw is a 55 y.o. male who presents for a follow-up visit regarding coronary artery disease. He has known history of poorly controlled type 2 diabetes, hypertension, hyperlipidemia, peripheral neuropathy, GERD and chronic back pain. [facility] of [facility] with inferior ST elevation myocardial infarction. Cardiac catheterization showed an occluded mid left circumflex with ejection fraction of 50%. He underwent successful angioplasty and drug-eluting stent placement to the mid left circumflex. There was an occluded small distal OM branch which was left to be treated medically. Echocardiogram showed an ejection fraction of 45% with grade 1 diastolic dysfunction.  He continues to smoke a few cigarettes a day. He reports intermittent episodes of substernal chest tightness which is left-sided with no radiation. It is described as tightness feeling at rest that resolved with nitroglycerin. This is usually not exertional. He complains of bilateral leg pain worse on the left side.   Past Medical History  Diagnosis Date  . DM2 (diabetes mellitus, type 2) (HCC)   . Hypertension   . Neuropathy (HCC)   . GERD (gastroesophageal reflux disease)   . HLD (hyperlipidemia)     Past Surgical History  Procedure Laterality Date  . Cardiac catheterization N/A 11/27/2014    Procedure: Left Heart Cath and Coronary Angiography;  Surgeon: Iran OuchMuhammad A Naod Sweetland, MD;  Location: [facility] INVASIVE CV LAB;  Service: Cardiovascular;  Laterality: N/A;  . Cardiac catheterization N/A 11/27/2014    Procedure: Coronary Stent Intervention;  Surgeon: Iran OuchMuhammad A Briseida Gittings, MD;  Location:  [facility] INVASIVE CV LAB;  Service: Cardiovascular;  Laterality: N/A;     Current Outpatient Prescriptions  Medication Sig Dispense Refill  . ALPRAZolam (XANAX) 0.5 MG tablet Take 0.5 mg by mouth 2 (two) times daily as needed for anxiety.    Marland Kitchen. aspirin 81 MG chewable tablet Chew 1 tablet (81 mg total) by mouth daily. 30 tablet 11  . atorvastatin (LIPITOR) 80 MG tablet Take 1 tablet (80 mg total) by mouth daily at 6 PM. 30 tablet 11  . carvedilol (COREG) 6.25 MG tablet Take 1 tablet (6.25 mg total) by mouth 2 (two) times daily with a meal. 60 tablet 11  . clopidogrel (PLAVIX) 75 MG tablet Take 1 tablet (75 mg total) by mouth daily. 30 tablet 5  . gabapentin (NEURONTIN) 300 MG capsule Take 300 mg by mouth daily.    Marland Kitchen. glipiZIDE (GLUCOTROL) 5 MG tablet Take by mouth daily before breakfast.    . lisinopril (PRINIVIL,ZESTRIL) 10 MG tablet Take 10 mg by mouth daily.    . metFORMIN (GLUCOPHAGE) 500 MG tablet Take 1 tablet (500 mg total) by mouth 3 (three) times daily. 90 tablet 11  . nitroGLYCERIN (NITROSTAT) 0.4 MG SL tablet Place 1 tab under tongue every 5 min prn chest pain, not to exceed 3 in 15 min 25 tablet 0   No current facility-administered medications for this visit.    Allergies:   Naproxen    Social History:  The patient  reports that he has been smoking Cigarettes.  He has been smoking about 0.25 packs per day. He  has never used smokeless tobacco. He reports that he drinks alcohol. He reports that he does not use illicit drugs.   Family History:  The patient's family history includes Diabetes in his father; Hyperlipidemia in his brother; Hypertension in his brother, father, and sister.    ROS:  Please see the history of present illness.   Otherwise, review of systems are positive for none.   All other systems are reviewed and negative.    PHYSICAL EXAM: VS:  BP 106/70 mmHg  Pulse 79  Ht  (1.88 m)  Wt 192 lb 4 oz (87.204 kg)  BMI 24.67 kg/m2 , BMI Body mass index is 24.67  kg/(m^2). GEN: Well nourished, well developed, in no acute distress HEENT: normal Neck: no JVD, carotid bruits, or masses Cardiac: RRR; no murmurs, rubs, or gallops,no edema  Respiratory:  clear to auscultation bilaterally, normal work of breathing GI: soft, nontender, nondistended, + BS MS: no deformity or atrophy Skin: warm and dry, no rash Neuro:  Strength and sensation are intact Psych: euthymic mood, full affect   EKG:  EKG is ordered today. The ekg ordered today demonstrates normal sinus rhythm with nonspecific T wave changes   Recent Labs: 11/28/2014: Hemoglobin 15.5; Platelets 158 11/29/2014: BUN 12; Creatinine, Ser 0.75; Magnesium 2.0; Potassium 3.8; Sodium 136 01/12/2015: ALT 38    Lipid Panel    Component Value Date/Time   CHOL 103 01/12/2015 0813   CHOL 236* 11/28/2014 0642   TRIG 156* 01/12/2015 0813   HDL 21* 01/12/2015 0813   HDL 30* 11/28/2014 0642   CHOLHDL 4.9 01/12/2015 0813   CHOLHDL 7.9 11/28/2014 0642   VLDL 58* 11/28/2014 0642   LDLCALC 51 01/12/2015 0813   LDLCALC 148* 11/28/2014 0642      Wt Readings from Last 3 Encounters:  09/27/15 192 lb 4 oz (87.204 kg)  03/29/15 188 lb 4 oz (85.39 kg)  12/14/14 193 lb (87.544 kg)        ASSESSMENT AND PLAN:  1.  Coronary artery disease involving native coronary arteries without angina: He is having atypical chest pain mostly at rest and not exertional but does respond to nitroglycerin. Thus, I requested a pharmacologic nuclear stress test. He is not able to exercise on a treadmill due to chronic back and neck pain. Continue aggressive medical therapy. Continue dual antiplatelet therapy at least until May 2017 and possibly longer.  2. Hyperlipidemia: Lipid profile showed significant improvement with most recent LDL of 51.  3. Tobacco use: I discussed with him again the importance of smoking cessation.  4. Ischemic cardiomyopathy: Ejection fraction was 45% at the time of his myocardial infarction.  Continue treatment with carvedilol and lisinopril.  5. Leg pain: His distal pulses are palpable but he does have multiple risk factors for peripheral arterial disease and thus I requested lower extremity arterial Doppler.     Disposition:   FU with me in 6 months  Signed,  Lorine Bears, MD  09/27/2015 4:36 PM    [facility]

## 2015-10-01 ENCOUNTER — Telehealth: Payer: Self-pay | Admitting: Cardiovascular Disease

## 2015-10-01 NOTE — Telephone Encounter (Signed)
Spoke w/ pt's wife.   Advised her of Ryan's recommendation. She will speak w/ her husband and call back if he would like to reschedule, as he was under the impression that Dr. Kirke CorinArida would be performing the test.

## 2015-10-01 NOTE — Telephone Encounter (Signed)
It has been documented he has chronic neck and back pain that precludes this. If he is able to do so and complete the study then he can perform a treadmill Myoview. Otherwise, Lexiscan should be ordered as previous.

## 2015-10-01 NOTE — Telephone Encounter (Signed)
Pt wife calling stating pt would not life to do Nuclear stress test  He would to try to run the treadmill Please advise with a date and time.

## 2015-10-03 ENCOUNTER — Other Ambulatory Visit: Payer: Self-pay | Admitting: Cardiovascular Disease

## 2015-10-03 DIAGNOSIS — I739 Peripheral vascular disease, unspecified: Secondary | ICD-10-CM

## 2015-10-04 ENCOUNTER — Encounter
Admission: RE | Admit: 2015-10-04 | Discharge: 2015-10-04 | Disposition: A | Discharge status: Home / Self Care | Payer: Medicaid Other | Source: Ambulatory Visit | Attending: Cardiovascular Disease | Admitting: Cardiovascular Disease

## 2015-10-04 DIAGNOSIS — R079 Chest pain, unspecified: Secondary | ICD-10-CM | POA: Insufficient documentation

## 2015-10-09 ENCOUNTER — Encounter
Admission: RE | Admit: 2015-10-09 | Discharge: 2015-10-09 | Disposition: A | Discharge status: Home / Self Care | Payer: Medicaid Other | Source: Ambulatory Visit | Attending: Cardiovascular Disease | Admitting: Cardiovascular Disease

## 2015-10-09 DIAGNOSIS — R079 Chest pain, unspecified: Secondary | ICD-10-CM

## 2015-10-09 LAB — NM MYOCAR MULTI W/SPECT W/WALL MOTION / EF
CHL CUP RESTING HR STRESS: 75 {beats}/min
CHL CUP STRESS STAGE 1 GRADE: 0 %
CHL CUP STRESS STAGE 1 HR: 74 {beats}/min
CHL CUP STRESS STAGE 1 SPEED: 0 mph
CHL CUP STRESS STAGE 4 HR: 115 {beats}/min
CHL CUP STRESS STAGE 4 SPEED: 0 mph
CHL CUP STRESS STAGE 5 SPEED: 0 mph
CHL CUP STRESS STAGE 6 GRADE: 0 %
CHL CUP STRESS STAGE 6 HR: 94 {beats}/min
CHL CUP STRESS STAGE 6 SBP: 156 mmHg
CSEPEW: 1 METS
CSEPPHR: 115 {beats}/min
LV sys vol: 46 mL
LVDIAVOL: 101 mL (ref 62–150)
NUC STRESS TID: 1.03
Percent HR: 69 %
Percent of predicted max HR: 68 %
SDS: 5
SRS: 9
SSS: 14
Stage 2 Grade: 0 %
Stage 2 HR: 79 {beats}/min
Stage 2 Speed: 0 mph
Stage 3 Grade: 0 %
Stage 3 HR: 79 {beats}/min
Stage 3 Speed: 0 mph
Stage 4 Grade: 0 %
Stage 5 DBP: 111 mmHg
Stage 5 Grade: 0 %
Stage 5 HR: 110 {beats}/min
Stage 5 SBP: 190 mmHg
Stage 6 DBP: 96 mmHg
Stage 6 Speed: 0 mph

## 2015-10-09 MED ORDER — REGADENOSON 0.4 MG/5ML IV SOLN
0.4000 mg | Freq: Once | INTRAVENOUS | Status: AC
  Administered 2015-10-09: 0.4 mg via INTRAVENOUS

## 2015-10-09 MED ORDER — TECHNETIUM TC 99M SESTAMIBI - CARDIOLITE
12.9600 | Freq: Once | INTRAVENOUS | Status: AC | PRN
  Administered 2015-10-09: 07:00:00 12.96 via INTRAVENOUS

## 2015-10-09 MED ORDER — TECHNETIUM TC 99M SESTAMIBI - CARDIOLITE
32.6100 | Freq: Once | INTRAVENOUS | Status: AC | PRN
  Administered 2015-10-09: 32.61 via INTRAVENOUS

## 2015-10-10 ENCOUNTER — Other Ambulatory Visit: Payer: Self-pay

## 2015-10-10 ENCOUNTER — Telehealth: Payer: Self-pay | Admitting: Cardiovascular Disease

## 2015-10-10 DIAGNOSIS — Z01812 Encounter for preprocedural laboratory examination: Secondary | ICD-10-CM

## 2015-10-10 NOTE — Telephone Encounter (Signed)
S/w pt who reports warmth bilateral arms this morning. Reports it has gotten better this afternoon but they both still feel slightly warm. Pt feels it is related to yesterday's myoview. Per Eula Listenyan Dunn, PA-C, [facility] Informed pt who verbalized understanding to continue to monitor and f/u PCP if symptoms persist.

## 2015-10-10 NOTE — Telephone Encounter (Signed)
Patient had a stress test yesterday morning and now c/o warmth in arms since he woke up.  Patient wants to know if this is normal or he is having a reaction.  Please call to discuss.

## 2015-10-16 ENCOUNTER — Ambulatory Visit: Payer: Medicaid Other

## 2015-10-16 DIAGNOSIS — M79606 Pain in leg, unspecified: Secondary | ICD-10-CM

## 2015-10-16 DIAGNOSIS — I739 Peripheral vascular disease, unspecified: Secondary | ICD-10-CM

## 2016-02-11 ENCOUNTER — Encounter: Payer: Self-pay | Admitting: *Deleted

## 2016-02-11 ENCOUNTER — Encounter: Payer: Medicaid Other | Attending: Internal Medicine | Admitting: *Deleted

## 2016-02-11 VITALS — BP 120/80 | Ht 73.0 in | Wt 190.9 lb

## 2016-02-11 DIAGNOSIS — E119 Type 2 diabetes mellitus without complications: Secondary | ICD-10-CM | POA: Insufficient documentation

## 2016-02-11 DIAGNOSIS — Z6825 Body mass index (BMI) 25.0-25.9, adult: Secondary | ICD-10-CM | POA: Diagnosis not present

## 2016-02-11 DIAGNOSIS — E1142 Type 2 diabetes mellitus with diabetic polyneuropathy: Secondary | ICD-10-CM

## 2016-02-11 DIAGNOSIS — Z713 Dietary counseling and surveillance: Secondary | ICD-10-CM | POA: Insufficient documentation

## 2016-02-11 NOTE — Patient Instructions (Addendum)
Check blood sugars 2 x day before breakfast and 2 hrs after supper every day  Exercise: Walk as tolerated   Eat 3 meals day, 1-2 snacks a day Space meals 4-6 hours apart Don't skip meals Complete 3 Day Food Record and bring to next appt  Bring blood sugar records to the next appointment  Return for appointment on: Wednesday February 27, 2016 at 9:30 am with Ryan Bradshaw

## 2016-02-11 NOTE — Progress Notes (Signed)
Diabetes Self-Management Education  Visit Type: First/Initial  Appt. Start Time: 1105 Appt. End Time: 1215  02/11/2016  [name], identified by name and date of birth, is a 55 y.o. male with a diagnosis of Diabetes: Type 2.   ASSESSMENT  Blood pressure 120/80, height 6\' 1"  (1.854 m), weight 190 lb 14.4 oz (86.6 kg). Body mass index is 25.19 kg/m.      Diabetes Self-Management Education - 02/11/16 1451      Visit Information   Visit Type First/Initial     Initial Visit   Diabetes Type Type 2   Are you currently following a meal plan? No   Are you taking your medications as prescribed? No  He has been taking Metformin 500 mg tid instead of 1000 mg bid. Reports he forgot MD changed it.    Date Diagnosed 20 years ago     Health Coping   How would you rate your overall health? Poor     Psychosocial Assessment   Patient Belief/Attitude about Diabetes Defeat/Burnout  "defeated - taken control of my body"   Self-care barriers None   Self-management support Doctor's office;Family   Patient Concerns Nutrition/Meal planning;Glycemic Control;Monitoring   Special Needs None   Preferred Learning Style Auditory   Learning Readiness Ready   How often do you need to have someone help you when you read instructions, pamphlets, or other written materials from your doctor or pharmacy? 3 - Sometimes  daughter completed paperwork   What is the last grade level you completed in school? 9th     Pre-Education Assessment   Patient understands the diabetes disease and treatment process. Needs Instruction   Patient understands incorporating nutritional management into lifestyle. Needs Instruction   Patient undertands incorporating physical activity into lifestyle. Needs Instruction   Patient understands using medications safely. Needs Instruction   Patient understands monitoring blood glucose, interpreting and using results Needs Review   Patient understands prevention, detection,  and treatment of acute complications. Needs Instruction   Patient understands prevention, detection, and treatment of chronic complications. Needs Instruction   Patient understands how to develop strategies to address psychosocial issues. Needs Instruction   Patient understands how to develop strategies to promote health/change behavior. Needs Instruction     Complications   Last HgB A1C per patient/outside source 10.2 %  01/31/16   How often do you check your blood sugar? 1-2 times/day   Fasting Blood glucose range (mg/dL) 413-244;>010180-200;>200  Pt reports FBG's 195-200's mg/dL   Postprandial Blood glucose range (mg/dL) 272-536;644-034;>742130-179;180-200;>200  Pt reports pp's 160-200's mg/dL   Have you had a dilated eye exam in the past 12 months? Yes   Have you had a dental exam in the past 12 months? Yes   Are you checking your feet? Yes   How many days per week are you checking your feet? 7     Dietary Intake   Breakfast cereal and milk; [place] bacon, eggs, bread   Snack (morning) crackers   Lunch sandwich, crackers, [employer]   Snack (afternoon) peanut butter sandwich   Dinner chicken, hamburgers; vegetable medley, green beans, sweet potato   Beverage(s) water, Diet Cheerwine     Exercise   Exercise Type ADL's     Patient Education   Previous Diabetes Education No   Disease state  Definition of diabetes, type 1 and 2, and the diagnosis of diabetes   Nutrition management  Role of diet in the treatment of diabetes and the relationship between the three main  macronutrients and blood glucose level   Physical activity and exercise  Role of exercise on diabetes management, blood pressure control and cardiac health.   Medications Reviewed patients medication for diabetes, action, purpose, timing of dose and side effects.   Monitoring Purpose and frequency of SMBG.;Identified appropriate SMBG and/or A1C goals.   Chronic complications Relationship between chronic complications and blood glucose control    Psychosocial adjustment Identified and addressed patients feelings and concerns about diabetes     Individualized Goals (developed by patient)   Reducing Risk Improve blood sugars Prevent diabetes complications     Outcomes   Expected Outcomes Demonstrated interest in learning. Expect positive outcomes      Individualized Plan for Diabetes Self-Management Training:   Learning Objective:  Patient will have a greater understanding of diabetes self-management. Patient education plan is to attend individual and/or group sessions per assessed needs and concerns.   Plan:   Patient Instructions  Check blood sugars 2 x day before breakfast and 2 hrs after supper every day Exercise: Walk as tolerated  Eat 3 meals day, 1-2 snacks a day Space meals 4-6 hours apart Don't skip meals Complete 3 Day Food Record and bring to next appt Bring blood sugar records to the next appointment Return for appointment on: Wednesday February 27, 2016 at 9:30 am with [facility]   Expected Outcomes:  Demonstrated interest in learning. Expect positive outcomes  Education material provided:  General Meal Planning Guidelines Simple Meal Plan 3 Day Food Record  If problems or questions, patient to contact team via:  Sharion Settler, RN, CCM, CDE [phone]  Future DSME appointment:  Wednesday February 27, 2016 with dietitian

## 2016-02-27 ENCOUNTER — Ambulatory Visit: Payer: Medicaid Other | Admitting: Dietician

## 2016-03-05 ENCOUNTER — Ambulatory Visit: Payer: Medicaid Other | Admitting: Dietician

## 2016-03-27 ENCOUNTER — Encounter: Payer: Self-pay | Admitting: Cardiovascular Disease

## 2016-03-27 ENCOUNTER — Ambulatory Visit (INDEPENDENT_AMBULATORY_CARE_PROVIDER_SITE_OTHER): Payer: Medicare Other | Admitting: Cardiovascular Disease

## 2016-03-27 VITALS — BP 126/62 | HR 86

## 2016-03-27 DIAGNOSIS — I10 Essential (primary) hypertension: Secondary | ICD-10-CM | POA: Diagnosis not present

## 2016-03-27 DIAGNOSIS — E785 Hyperlipidemia, unspecified: Secondary | ICD-10-CM | POA: Diagnosis not present

## 2016-03-27 DIAGNOSIS — I2510 Atherosclerotic heart disease of native coronary artery without angina pectoris: Secondary | ICD-10-CM | POA: Diagnosis not present

## 2016-03-27 DIAGNOSIS — Z720 Tobacco use: Secondary | ICD-10-CM | POA: Diagnosis not present

## 2016-03-27 NOTE — Patient Instructions (Signed)
Medication Instructions: Continue same medications.   Labwork: None.   Procedures/Testing: None.   Follow-Up: 1 year follow up with Dr. Arida.   Any Additional Special Instructions Will Be Listed Below (If Applicable).     If you need a refill on your cardiac medications before your next appointment, please call your pharmacy.   

## 2016-03-27 NOTE — Progress Notes (Signed)
Cardiology Office Note   Date:  03/27/2016   ID:  KALEL HARTY, DOB [birth year], MRN [id number]  PCP:  Jaclyn Shaggy, MD  Cardiologist:   Lorine Bears, MD   Chief Complaint  Patient presents with  . other    6 month f/u. Meds reviewed verbally with pt. Overall "doing well"      History of Present Illness: Ryan Bradshaw is a 55 y.o. male who presents for a follow-up visit regarding coronary artery disease. He has known history of poorly controlled type 2 diabetes, hypertension, hyperlipidemia, peripheral neuropathy, GERD and chronic back pain. He presented to [facility] in May of 2016 with inferior ST elevation myocardial infarction. Cardiac catheterization showed an occluded mid left circumflex with ejection fraction of 50%. He underwent successful angioplasty and drug-eluting stent placement to the mid left circumflex. There was an occluded small distal OM branch which was left to be treated medically. Echocardiogram showed an ejection fraction of 45% with grade 1 diastolic dysfunction.  He continues to smoke a few cigarettes a day.  During last visit, he reported intermittent episodes of mild exertional chest pain. I proceeded with a pharmacologic nuclear stress test in April which showed no evidence of ischemia. There was a fixed inferolateral defect consistent with his prior myocardial infarction. He complained of atypical leg pain. ABI was normal. He has been doing well and denies any chest pain or shortness of breath. He continues to work and diabetes control. Most recent hemoglobin A1c was 10.2. He follows up with endocrinology.   Past Medical History:  Diagnosis Date  . Depression   . DM2 (diabetes mellitus, type 2) (HCC)   . GERD (gastroesophageal reflux disease)   . HLD (hyperlipidemia)   . Hypertension   . Myocardial infarction (HCC)   . Neuropathy [facility])     Past Surgical History:  Procedure Laterality Date  . CARDIAC CATHETERIZATION N/A 11/27/2014   Procedure:  Left Heart Cath and Coronary Angiography;  Surgeon: Iran Ouch, MD;  Location: [facility] INVASIVE CV LAB;  Service: Cardiovascular;  Laterality: N/A;  . CARDIAC CATHETERIZATION N/A 11/27/2014   Procedure: Coronary Stent Intervention;  Surgeon: Iran Ouch, MD;  Location: [facility] INVASIVE CV LAB;  Service: Cardiovascular;  Laterality: N/A;     Current Outpatient Prescriptions  Medication Sig Dispense Refill  . ALPRAZolam (XANAX) 0.5 MG tablet Take 0.5 mg by mouth 2 (two) times daily as needed for anxiety.    Marland Kitchen aspirin 81 MG chewable tablet Chew 1 tablet (81 mg total) by mouth daily. 30 tablet 11  . atorvastatin (LIPITOR) 80 MG tablet Take 1 tablet (80 mg total) by mouth daily at 6 PM. 30 tablet 11  . carvedilol (COREG) 6.25 MG tablet Take 1 tablet (6.25 mg total) by mouth 2 (two) times daily with a meal. 60 tablet 11  . clopidogrel (PLAVIX) 75 MG tablet Take 1 tablet (75 mg total) by mouth daily. 30 tablet 5  . gabapentin (NEURONTIN) 300 MG capsule Take 300 mg by mouth daily.    Marland Kitchen lisinopril (PRINIVIL,ZESTRIL) 10 MG tablet Take 10 mg by mouth daily.    . metFORMIN (GLUCOPHAGE) 500 MG tablet Take 1,000 mg by mouth 2 (two) times daily.    . nitroGLYCERIN (NITROSTAT) 0.4 MG SL tablet Place 1 tab under tongue every 5 min prn chest pain, not to exceed 3 in 15 min 25 tablet 0  . sitaGLIPtin (JANUVIA) 100 MG tablet Take 100 mg by mouth daily.     No  current facility-administered medications for this visit.     Allergies:   Naproxen    Social History:  The patient  reports that he has been smoking Cigarettes.  He has a 9.25 pack-year smoking history. He has never used smokeless tobacco. He reports that he drinks about 0.6 oz of alcohol per week . He reports that he does not use drugs.   Family History:  The patient's family history includes Diabetes in his brother, brother, and father; Hyperlipidemia in his brother; Hypertension in his brother, father, and sister.    ROS:  Please see the  history of present illness.   Otherwise, review of systems are positive for none.   All other systems are reviewed and negative.    PHYSICAL EXAM: VS:  BP 126/62 (BP Location: Left Arm, Patient Position: Sitting, Cuff Size: Normal)   Pulse 86   Ht (P) 6\' 1"  (1.854 m)   Wt (P) 189 lb 12 oz (86.1 kg)   BMI (P) 25.03 kg/m  , BMI Body mass index is 25.03 kg/m (pended). GEN: Well nourished, well developed, in no acute distress  HEENT: normal  Neck: no JVD, carotid bruits, or masses Cardiac: RRR; no murmurs, rubs, or gallops,no edema  Respiratory:  clear to auscultation bilaterally, normal work of breathing GI: soft, nontender, nondistended, + BS MS: no deformity or atrophy  Skin: warm and dry, no rash Neuro:  Strength and sensation are intact Psych: euthymic mood, full affect   EKG:  EKG is ordered today. The ekg ordered today demonstrates normal sinus rhythm with nonspecific T wave changes   Recent Labs: No results found for requested labs within last 8760 hours.    Lipid Panel    Component Value Date/Time   CHOL 103 01/12/2015 0813   TRIG 156 (H) 01/12/2015 0813   HDL 21 (L) 01/12/2015 0813   CHOLHDL 4.9 01/12/2015 0813   CHOLHDL 7.9 11/28/2014 0642   VLDL 58 (H) 11/28/2014 0642   LDLCALC 51 01/12/2015 0813      Wt Readings from Last 3 Encounters:  03/27/16 (P) 189 lb 12 oz (86.1 kg)  02/11/16 190 lb 14.4 oz (86.6 kg)  09/27/15 192 lb 4 oz (87.2 kg)        ASSESSMENT AND PLAN:  1.  Coronary artery disease involving native coronary arteries without angina:  No longer with chest pain. Nuclear stress test earlier this year showed no evidence of ischemia. There was evidence of prior inferolateral infarct consistent with his history. Continue medical therapy. I am planning to use dual antiplatelet therapy long-term for at least another year.  2. Hyperlipidemia: Lipid profile showed significant improvement with most recent LDL of 51.  3. Tobacco use: I discussed with  him again the importance of smoking cessation. He is down to a few cigarettes a day.  4. Ischemic cardiomyopathy: Ejection fraction was 45% at the time of his myocardial infarction. Continue treatment with carvedilol and lisinopril.  5. Essential hypertension: Blood pressure is controlled on current medications.  6. Diabetes mellitus: Still not controlled with most recent hemoglobin A1c of 10.2. He follows up with Dr. Tedd Siassolum.     Disposition:   FU with me in 12 months  Signed,  Lorine BearsMuhammad Jelitza Manninen, MD  03/27/2016 9:36 AM    [facility]

## 2016-03-28 ENCOUNTER — Encounter: Payer: Self-pay | Admitting: Dietician

## 2016-03-28 NOTE — Progress Notes (Signed)
Patient missed 2 consecutive appointments, and has not called back to reschedule. Sent discharge letter to MD.

## 2016-04-10 ENCOUNTER — Encounter: Payer: Self-pay | Admitting: Cardiovascular Disease

## 2016-06-27 ENCOUNTER — Emergency Department: Payer: Medicare Other

## 2016-06-27 ENCOUNTER — Emergency Department
Admission: EM | Admit: 2016-06-27 | Discharge: 2016-06-27 | Disposition: A | Discharge status: Home / Self Care | Payer: Medicare Other | Attending: Emergency Medicine | Admitting: Emergency Medicine

## 2016-06-27 DIAGNOSIS — Z7984 Long term (current) use of oral hypoglycemic drugs: Secondary | ICD-10-CM | POA: Diagnosis not present

## 2016-06-27 DIAGNOSIS — I10 Essential (primary) hypertension: Secondary | ICD-10-CM | POA: Insufficient documentation

## 2016-06-27 DIAGNOSIS — N23 Unspecified renal colic: Secondary | ICD-10-CM | POA: Insufficient documentation

## 2016-06-27 DIAGNOSIS — I2510 Atherosclerotic heart disease of native coronary artery without angina pectoris: Secondary | ICD-10-CM | POA: Insufficient documentation

## 2016-06-27 DIAGNOSIS — R109 Unspecified abdominal pain: Secondary | ICD-10-CM

## 2016-06-27 DIAGNOSIS — Z7982 Long term (current) use of aspirin: Secondary | ICD-10-CM | POA: Insufficient documentation

## 2016-06-27 DIAGNOSIS — E119 Type 2 diabetes mellitus without complications: Secondary | ICD-10-CM | POA: Diagnosis not present

## 2016-06-27 DIAGNOSIS — F17210 Nicotine dependence, cigarettes, uncomplicated: Secondary | ICD-10-CM | POA: Insufficient documentation

## 2016-06-27 LAB — CBC WITH DIFFERENTIAL/PLATELET
BASOS ABS: 0.1 10*3/uL (ref 0–0.1)
BASOS PCT: 1 %
EOS ABS: 0.3 10*3/uL (ref 0–0.7)
Eosinophils Relative: 2 %
HEMATOCRIT: 48.7 % (ref 40.0–52.0)
HEMOGLOBIN: 16.7 g/dL (ref 13.0–18.0)
Lymphocytes Relative: 19 %
Lymphs Abs: 2.7 10*3/uL (ref 1.0–3.6)
MCH: 30.2 pg (ref 26.0–34.0)
MCHC: 34.2 g/dL (ref 32.0–36.0)
MCV: 88.4 fL (ref 80.0–100.0)
MONOS PCT: 8 %
Monocytes Absolute: 1.1 10*3/uL — ABNORMAL HIGH (ref 0.2–1.0)
NEUTROS PCT: 70 %
Neutro Abs: 10.2 10*3/uL — ABNORMAL HIGH (ref 1.4–6.5)
Platelets: 202 10*3/uL (ref 150–440)
RBC: 5.52 MIL/uL (ref 4.40–5.90)
RDW: 13.3 % (ref 11.5–14.5)
WBC: 14.4 10*3/uL — AB (ref 3.8–10.6)

## 2016-06-27 LAB — URINALYSIS, COMPLETE (UACMP) WITH MICROSCOPIC
BACTERIA UA: NONE SEEN
Bilirubin Urine: NEGATIVE
KETONES UR: NEGATIVE mg/dL
Leukocytes, UA: NEGATIVE
NITRITE: NEGATIVE
PH: 5 (ref 5.0–8.0)
PROTEIN: 30 mg/dL — AB
Specific Gravity, Urine: 1.029 (ref 1.005–1.030)

## 2016-06-27 LAB — BASIC METABOLIC PANEL
ANION GAP: 8 (ref 5–15)
BUN: 15 mg/dL (ref 6–20)
CHLORIDE: 103 mmol/L (ref 101–111)
CO2: 26 mmol/L (ref 22–32)
Calcium: 9.6 mg/dL (ref 8.9–10.3)
Creatinine, Ser: 0.91 mg/dL (ref 0.61–1.24)
GFR calc non Af Amer: >60 mL/min (ref >60)
Glucose, Bld: 317 mg/dL — ABNORMAL HIGH (ref 65–99)
POTASSIUM: 4.3 mmol/L (ref 3.5–5.1)
SODIUM: 137 mmol/L (ref 135–145)

## 2016-06-27 MED ORDER — OXYCODONE-ACETAMINOPHEN 5-325 MG PO TABS
1.0000 | ORAL_TABLET | Freq: Once | ORAL | Status: AC
  Administered 2016-06-27: 1 via ORAL
  Filled 2016-06-27: qty 1

## 2016-06-27 MED ORDER — OXYCODONE-ACETAMINOPHEN 5-325 MG PO TABS: 1.0000 | ORAL_TABLET | ORAL | 0 refills | Status: DC | PRN

## 2016-06-27 MED ORDER — HYDROMORPHONE HCL 1 MG/ML IJ SOLN
1.0000 mg | Freq: Once | INTRAMUSCULAR | Status: AC
  Administered 2016-06-27: 1 mg via INTRAVENOUS
  Filled 2016-06-27 (×2): qty 1

## 2016-06-27 MED ORDER — TAMSULOSIN HCL 0.4 MG PO CAPS: 0.4000 mg | ORAL_CAPSULE | Freq: Every day | ORAL | 0 refills | Status: DC

## 2016-06-27 MED ORDER — ONDANSETRON 4 MG PO TBDP: 4.0000 mg | ORAL_TABLET | Freq: Three times a day (TID) | ORAL | 0 refills | Status: DC | PRN

## 2016-06-27 MED ORDER — HYDROMORPHONE HCL 1 MG/ML IJ SOLN
1.0000 mg | Freq: Once | INTRAMUSCULAR | Status: AC
  Administered 2016-06-27: 1 mg via INTRAVENOUS
  Filled 2016-06-27: qty 1

## 2016-06-27 MED ORDER — ONDANSETRON HCL 4 MG/2ML IJ SOLN
4.0000 mg | Freq: Once | INTRAMUSCULAR | Status: AC
  Administered 2016-06-27: 4 mg via INTRAVENOUS

## 2016-06-27 MED ORDER — SODIUM CHLORIDE 0.9 % IV BOLUS (SEPSIS)
1000.0000 mL | Freq: Once | INTRAVENOUS | Status: AC
Start: 2016-06-27 — End: 2016-06-27
  Administered 2016-06-27: 1000 mL via INTRAVENOUS

## 2016-06-27 MED ORDER — ONDANSETRON HCL 4 MG/2ML IJ SOLN
INTRAMUSCULAR | Status: AC
  Administered 2016-06-27: 4 mg via INTRAVENOUS
  Filled 2016-06-27: qty 2

## 2016-06-27 MED ORDER — ONDANSETRON HCL 4 MG/2ML IJ SOLN
4.0000 mg | Freq: Once | INTRAMUSCULAR | Status: AC
  Administered 2016-06-27: 4 mg via INTRAVENOUS
  Filled 2016-06-27 (×2): qty 2

## 2016-06-27 MED ORDER — TAMSULOSIN HCL 0.4 MG PO CAPS
0.4000 mg | ORAL_CAPSULE | Freq: Once | ORAL | Status: AC
  Administered 2016-06-27: 0.4 mg via ORAL
  Filled 2016-06-27: qty 1

## 2016-06-27 NOTE — ED Provider Notes (Signed)
[facility] Emergency Department Provider Note   ____________________________________________   First MD Initiated Contact with Patient 06/27/16 0148     (approximate)  I have reviewed the triage vital signs and the nursing notes.   HISTORY  Chief Complaint Flank Pain    HPI Ryan Bradshaw is a 55 y.o. male who presents to the ED from home with a chief complaint of left flank pain. Patient reports onset of left flank pain yesterday which was tolerable; increased in intensity approximately 11 PM. History of kidney stones and states this feels similarly. Symptoms associated with nausea only. Denies associated fever, chills, chest pain, shortness of breath, hematuria, dysuria. Denies recent travel or trauma. Nothing makes his symptoms better or worse.   Past Medical History:  Diagnosis Date  . Depression   . DM2 (diabetes mellitus, type 2) (HCC)   . GERD (gastroesophageal reflux disease)   . HLD (hyperlipidemia)   . Hypertension   . Myocardial infarction   . Neuropathy [facility])     Patient Active Problem List   Diagnosis Date Noted  . Tobacco use 03/29/2015  . Coronary artery disease involving native coronary artery without angina pectoris 12/14/2014  . Pain in the chest   . Encounter for smoking cessation counseling   . Hyperlipemia   . ST elevation myocardial infarction (STEMI) of inferior wall (HCC) 11/27/2014    Past Surgical History:  Procedure Laterality Date  . CARDIAC CATHETERIZATION N/A 11/27/2014   Procedure: Left Heart Cath and Coronary Angiography;  Surgeon: Iran Ouch, MD;  Location: [facility] INVASIVE CV LAB;  Service: Cardiovascular;  Laterality: N/A;  . CARDIAC CATHETERIZATION N/A 11/27/2014   Procedure: Coronary Stent Intervention;  Surgeon: Iran Ouch, MD;  Location: [facility] INVASIVE CV LAB;  Service: Cardiovascular;  Laterality: N/A;    Prior to Admission medications   Medication Sig Start Date End Date Taking?  Authorizing Provider  ALPRAZolam Prudy Feeler) 0.5 MG tablet Take 0.5 mg by mouth 2 (two) times daily as needed for anxiety.    Historical Provider, MD  aspirin 81 MG chewable tablet Chew 1 tablet (81 mg total) by mouth daily. 11/29/14   Sondra Barges, PA-C  atorvastatin (LIPITOR) 80 MG tablet Take 1 tablet (80 mg total) by mouth daily at 6 PM. 11/29/14   Sondra Barges, PA-C  carvedilol (COREG) 6.25 MG tablet Take 1 tablet (6.25 mg total) by mouth 2 (two) times daily with a meal. 11/29/14   Sondra Barges, PA-C  clopidogrel (PLAVIX) 75 MG tablet Take 1 tablet (75 mg total) by mouth daily. 12/14/14   Iran Ouch, MD  gabapentin (NEURONTIN) 300 MG capsule Take 300 mg by mouth daily.    Historical Provider, MD  lisinopril (PRINIVIL,ZESTRIL) 10 MG tablet Take 10 mg by mouth daily.    Historical Provider, MD  metFORMIN (GLUCOPHAGE) 500 MG tablet Take 1,000 mg by mouth 2 (two) times daily. 10/26/15   Historical Provider, MD  nitroGLYCERIN (NITROSTAT) 0.4 MG SL tablet Place 1 tab under tongue every 5 min prn chest pain, not to exceed 3 in 15 min 12/14/14   Iran Ouch, MD  ondansetron (ZOFRAN ODT) 4 MG disintegrating tablet Take 1 tablet (4 mg total) by mouth every 8 (eight) hours as needed for nausea or vomiting. 06/27/16   Irean Hong, MD  oxyCODONE-acetaminophen (ROXICET) 5-325 MG tablet Take 1 tablet by mouth every 4 (four) hours as needed for severe pain. 06/27/16   Irean Hong, MD  sitaGLIPtin (JANUVIA) 100 MG tablet Take 100 mg by mouth daily. 02/01/16 01/31/17  Historical Provider, MD  tamsulosin (FLOMAX) 0.4 MG CAPS capsule Take 1 capsule (0.4 mg total) by mouth daily. 06/27/16   Irean HongJade J Nykia Turko, MD    Allergies Naproxen  Family History  Problem Relation Age of Onset  . Hypertension Father   . Diabetes Father   . Hypertension Sister   . Hypertension Brother   . Hyperlipidemia Brother   . Diabetes Brother   . Diabetes Brother     Social History Social History  Substance Use Topics  . Smoking status:  Current Every Day Smoker    Packs/day: 0.25    Years: 37.00    Types: Cigarettes    Last attempt to quit: 12/06/2014  . Smokeless tobacco: Never Used  . Alcohol use 0.6 oz/week    1 Glasses of wine per week    Review of Systems  Constitutional: No fever/chills. Eyes: No visual changes. ENT: No sore throat. Cardiovascular: Denies chest pain. Respiratory: Denies shortness of breath. Gastrointestinal: Positive for left flank and left abdominal pain.  No nausea, no vomiting.  No diarrhea.  No constipation. Genitourinary: Negative for dysuria. Musculoskeletal: Negative for back pain. Skin: Negative for rash. Neurological: Negative for headaches, focal weakness or numbness.  10-point ROS otherwise negative.  ____________________________________________   PHYSICAL EXAM:  VITAL SIGNS: ED Triage Vitals [06/27/16 0138]  Enc Vitals Group     BP (!) 184/95     Pulse Rate 65     Resp 20     Temp 97.5 F (36.4 C)     Temp Source Oral     SpO2 96 %     Weight 192 lb (87.1 kg)     Height 6\' 1"  (1.854 m)     Head Circumference      Peak Flow      Pain Score 5     Pain Loc      Pain Edu?      Excl. in GC?     Examined after IV Dilaudid administration: Constitutional: Alert and oriented. Well appearing and in no acute distress. Eyes: Conjunctivae are normal. PERRL. EOMI. Head: Atraumatic. Nose: No congestion/rhinnorhea. Mouth/Throat: Mucous membranes are moist.  Oropharynx non-erythematous. Neck: No stridor.   Cardiovascular: Normal rate, regular rhythm. Grossly normal heart sounds.  Good peripheral circulation. Respiratory: Normal respiratory effort.  No retractions. Lungs CTAB. Gastrointestinal: Soft and nontender to light or deep palpation. No distention. No abdominal bruits. Mild left CVA tenderness. Musculoskeletal: No lower extremity tenderness nor edema.  No joint effusions. Neurologic:  Normal speech and language. No gross focal neurologic deficits are appreciated. No  gait instability. Skin:  Skin is warm, dry and intact. No rash noted. Psychiatric: Mood and affect are normal. Speech and behavior are normal.  ____________________________________________   LABS (all labs ordered are listed, but only abnormal results are displayed)  Labs Reviewed  CBC WITH DIFFERENTIAL/PLATELET - Abnormal; Notable for the following:       Result Value   WBC 14.4 (*)    Neutro Abs 10.2 (*)    Monocytes Absolute 1.1 (*)    All other components within normal limits  BASIC METABOLIC PANEL - Abnormal; Notable for the following:    Glucose, Bld 317 (*)    All other components within normal limits  URINALYSIS, COMPLETE (UACMP) WITH MICROSCOPIC - Abnormal; Notable for the following:    Color, Urine YELLOW (*)    APPearance CLEAR (*)  Glucose, UA >=500 (*)    Hgb urine dipstick SMALL (*)    Protein, ur 30 (*)    Squamous Epithelial / LPF 0-5 (*)    All other components within normal limits   ____________________________________________  EKG  None ____________________________________________  RADIOLOGY  CT renal colic study interpreted per Dr. Clovis RileyMitchell: Obstructing 4 x 6 mm left UVJ calculus with marked hydronephrosis  and hydroureter. Right nephrolithiasis.   ____________________________________________   PROCEDURES  Procedure(s) performed: None  Procedures  Critical Care performed: No  ____________________________________________   INITIAL IMPRESSION / ASSESSMENT AND PLAN / ED COURSE  Pertinent labs & imaging results that were available during my care of the patient were reviewed by me and considered in my medical decision making (see chart for details).  55 year old male who presents with left flank pain similar to prior kidney stone pain. Will obtain screening lab work, initiate IV fluid resuscitation, IV analgesia and reassess. Do not feel CT imaging is necessary unless there is evidence of renal insufficiency or persistent pain.  Clinical  Course as of Jun 27 738  Fri Jun 27, 2016  [id number] Updated patient and spouse of CT imaging results. Discussed with urology Dr. Sherryl BartersBudzyn who recommends discharge home with outpatient follow-up in 1-2 weeks. Strict return precautions given. Both verbalize understanding and agree with plan of care.  [JS]    Clinical Course User Index [JS] Irean HongJade J Shoni Quijas, MD     ____________________________________________   FINAL CLINICAL IMPRESSION(S) / ED DIAGNOSES  Final diagnoses:  Left flank pain  Ureteral colic      NEW MEDICATIONS STARTED DURING THIS VISIT:  Discharge Medication List as of 06/27/2016  4:33 AM    START taking these medications   Details  ondansetron (ZOFRAN ODT) 4 MG disintegrating tablet Take 1 tablet (4 mg total) by mouth every 8 (eight) hours as needed for nausea or vomiting., Starting Fri 06/27/2016, Print    oxyCODONE-acetaminophen (ROXICET) 5-325 MG tablet Take 1 tablet by mouth every 4 (four) hours as needed for severe pain., Starting Fri 06/27/2016, Print    tamsulosin (FLOMAX) 0.4 MG CAPS capsule Take 1 capsule (0.4 mg total) by mouth daily., Starting Fri 06/27/2016, Print         Note:  This document was prepared using Dragon voice recognition software and may include unintentional dictation errors.    Irean HongJade J Patirica Longshore, MD 06/27/16 [phone]

## 2016-06-27 NOTE — ED Notes (Signed)
Pt's O2 sats noted at 91% on room air, MD notified; placed pt on O2 via nasal cannula 2L

## 2016-06-27 NOTE — ED Notes (Signed)
Pt is in stable condition; discharge instructions reviewed, follow up care and home care reviewed; prescription medication reviewed; pt verbalized understanding; pt is ambulatory and went home with wife

## 2016-06-27 NOTE — Discharge Instructions (Signed)
1. Take pain & nausea medicines as needed (Percocet/Zofran #30). Make sure to take a stool softener while taking narcotic pain medicines. 2. Take Flomax 0.4mg daily x 14 days. 3. Drink plenty of bottled or filtered water daily. 4. Return to the ER for worsening symptoms, persistent vomiting, fever, difficulty breathing or other concerns.  

## 2016-06-27 NOTE — ED Triage Notes (Signed)
Pt In with co left flank pain, hx of kidney stones states feels the same.

## 2016-07-14 ENCOUNTER — Ambulatory Visit (INDEPENDENT_AMBULATORY_CARE_PROVIDER_SITE_OTHER): Payer: Medicare HMO | Admitting: Urology

## 2016-07-14 VITALS — BP 127/91 | HR 80 | Ht 73.0 in | Wt 188.8 lb

## 2016-07-14 DIAGNOSIS — N200 Calculus of kidney: Secondary | ICD-10-CM | POA: Diagnosis not present

## 2016-07-14 LAB — URINALYSIS, COMPLETE
Bilirubin, UA: NEGATIVE
Ketones, UA: NEGATIVE
LEUKOCYTES UA: NEGATIVE
Nitrite, UA: NEGATIVE
PROTEIN UA: NEGATIVE
RBC, UA: NEGATIVE
Specific Gravity, UA: 1.015 (ref 1.005–1.030)
Urobilinogen, Ur: 0.2 mg/dL (ref 0.2–1.0)
pH, UA: 5.5 (ref 5.0–7.5)

## 2016-07-14 LAB — MICROSCOPIC EXAMINATION: Bacteria, UA: NONE SEEN

## 2016-07-14 NOTE — Progress Notes (Signed)
07/14/2016 7:00 AM   Suzan NailerJeffrey W Bradshaw 10/27/1960 [id number]  Referring provider: Jaclyn Shaggyenny C Tate, MD [address]   [place], [place] [id number]  CC: - new patient nephrolithiasis  HPI:  1 - Nephrolithiasis Pre 2017 - medical passage ureteral stone x 1 06/2016 - ER CT 4mm left UVJ sotne with mild hydro (SSD 12cm, 700HU), 6mm rt mid intra-renal stone non-obstructing. UA w/o infecitous parameters. Cr <1.0. Given trial of medical therapy with tamsulosin and pain meds and thinks he passed stone, (had sensation of passage and no pain since, but did not actually capture stone).   PMH sig for CAD/MI/Stent/Plavix, DM2 (sugars usually 300s).   Today "Ryan Bradshaw" is seen as new patient for above. [facility].     PMH: Past Medical History:  Diagnosis Date  . Depression   . DM2 (diabetes mellitus, type 2) (HCC)   . GERD (gastroesophageal reflux disease)   . HLD (hyperlipidemia)   . Hypertension   . Myocardial infarction   . Neuropathy [facility](HCC)     Surgical History: Past Surgical History:  Procedure Laterality Date  . CARDIAC CATHETERIZATION N/A 11/27/2014   Procedure: Left Heart Cath and Coronary Angiography;  Surgeon: Iran OuchMuhammad A Arida, MD;  Location: [facility] INVASIVE CV LAB;  Service: Cardiovascular;  Laterality: N/A;  . CARDIAC CATHETERIZATION N/A 11/27/2014   Procedure: Coronary Stent Intervention;  Surgeon: Iran OuchMuhammad A Arida, MD;  Location: [facility] INVASIVE CV LAB;  Service: Cardiovascular;  Laterality: N/A;    Home Medications:  Allergies as of 07/14/2016      Reactions   Naproxen Other (See Comments)   Reaction:  Blood in stools      Medication List       Accurate as of 07/14/16  7:00 AM. Always use your most recent med list.          ALPRAZolam 0.5 MG tablet Commonly known as:  XANAX Take 0.5 mg by mouth 2 (two) times daily as needed for anxiety.   aspirin 81 MG chewable tablet Chew 1 tablet (81 mg total) by mouth daily.   atorvastatin 80 MG tablet Commonly  known as:  LIPITOR Take 1 tablet (80 mg total) by mouth daily at 6 PM.   carvedilol 6.25 MG tablet Commonly known as:  COREG Take 1 tablet (6.25 mg total) by mouth 2 (two) times daily with a meal.   clopidogrel 75 MG tablet Commonly known as:  PLAVIX Take 1 tablet (75 mg total) by mouth daily.   gabapentin 300 MG capsule Commonly known as:  NEURONTIN Take 300 mg by mouth daily.   lisinopril 10 MG tablet Commonly known as:  PRINIVIL,ZESTRIL Take 10 mg by mouth daily.   metFORMIN 500 MG tablet Commonly known as:  GLUCOPHAGE Take 1,000 mg by mouth 2 (two) times daily.   nitroGLYCERIN 0.4 MG SL tablet Commonly known as:  NITROSTAT Place 1 tab under tongue every 5 min prn chest pain, not to exceed 3 in 15 min   ondansetron 4 MG disintegrating tablet Commonly known as:  ZOFRAN ODT Take 1 tablet (4 mg total) by mouth every 8 (eight) hours as needed for nausea or vomiting.   oxyCODONE-acetaminophen 5-325 MG tablet Commonly known as:  ROXICET Take 1 tablet by mouth every 4 (four) hours as needed for severe pain.   sitaGLIPtin 100 MG tablet Commonly known as:  JANUVIA Take 100 mg by mouth daily.   tamsulosin 0.4 MG Caps capsule Commonly known as:  FLOMAX Take 1  capsule (0.4 mg total) by mouth daily.       Allergies:  Allergies  Allergen Reactions  . Naproxen Other (See Comments)    Reaction:  Blood in stools    Family History: Family History  Problem Relation Age of Onset  . Hypertension Father   . Diabetes Father   . Hypertension Sister   . Hypertension Brother   . Hyperlipidemia Brother   . Diabetes Brother   . Diabetes Brother     Social History:  reports that he has been smoking Cigarettes.  He has a 9.25 pack-year smoking history. He has never used smokeless tobacco. He reports that he drinks about 0.6 oz of alcohol per week . He reports that he does not use drugs.     Review of Systems  Gastrointestinal (upper)  : Negative for upper GI  symptoms  Gastrointestinal (lower) : Negative for lower GI symptoms  Constitutional : Negative for symptoms  Skin: Negative for skin symptoms  Eyes: Negative for eye symptoms  Ear/Nose/Throat : Negative for Ear/Nose/Throat symptoms  Hematologic/Lymphatic: Negative for Hematologic/Lymphatic symptoms  Cardiovascular : Negative for cardiovascular symptoms  Respiratory : Negative for respiratory symptoms  Endocrine: Negative for endocrine symptoms  Musculoskeletal: Negative for musculoskeletal symptoms  Neurological: Negative for neurological symptoms  Psychologic: Negative for psychiatric symptoms    Physical Exam: There were no vitals taken for this visit.  Constitutional:  Alert and oriented, No acute distress. HEENT: Noel AT, moist mucus membranes.  Trachea midline, no masses. Cardiovascular: No clubbing, cyanosis, or edema. Respiratory: Normal respiratory effort, no increased work of breathing. GI: Abdomen is soft, nontender, nondistended, no abdominal masses GU: No CVAT whatsoever. Phallus circ'd and straight. No scrotal masses / hernias.  Skin: No rashes, bruises or suspicious lesions. Lymph: No cervical or inguinal adenopathy. Neurologic: Grossly intact, no focal deficits, moving all 4 extremities. Psychiatric: Normal mood and affect.  Laboratory Data: Lab Results  Component Value Date   WBC 14.4 (H) 06/27/2016   HGB 16.7 06/27/2016   HCT 48.7 06/27/2016   MCV 88.4 06/27/2016   PLT 202 06/27/2016    Lab Results  Component Value Date   CREATININE 0.91 06/27/2016    No results found for: PSA  No results found for: TESTOSTERONE  No results found for: HGBA1C  Urinalysis    Component Value Date/Time   COLORURINE YELLOW (A) 06/27/2016 0149   APPEARANCEUR CLEAR (A) 06/27/2016 0149   LABSPEC 1.029 06/27/2016 0149   PHURINE 5.0 06/27/2016 0149   GLUCOSEU >=500 (A) 06/27/2016 0149   HGBUR SMALL (A) 06/27/2016 0149   BILIRUBINUR NEGATIVE  06/27/2016 0149   KETONESUR NEGATIVE 06/27/2016 0149   PROTEINUR 30 (A) 06/27/2016 0149   NITRITE NEGATIVE 06/27/2016 0149   LEUKOCYTESUR NEGATIVE 06/27/2016 0149    Pertinent Imaging: As per above  Assessment & Plan:  1 - Nephrolithiasis - discussed optiosn for current ureteral stone including medical therapy (approx 50% passage), SWL (not preferred given distal location and plavix use), and URS (treatment of choice for distal stones, rec treatemtn left side only given plavix use and level of comorbidity).   At this point he is confident he passed the left ureteral stone and his lack of colic x few weeks would support this.  Offered additional eval with KUB/RUS but he declines at this point. He will notify us if colic symptoms return. This is reasonable.    Sebastian Ache, MD  [facility] [address], [address] [place], [place] [address] [phone]

## 2017-04-23 ENCOUNTER — Ambulatory Visit: Payer: Medicare Other | Admitting: Cardiovascular Disease

## 2017-06-09 ENCOUNTER — Encounter: Payer: Self-pay | Admitting: Cardiovascular Disease

## 2017-06-09 ENCOUNTER — Ambulatory Visit (INDEPENDENT_AMBULATORY_CARE_PROVIDER_SITE_OTHER): Payer: Medicare HMO | Admitting: Cardiovascular Disease

## 2017-06-09 VITALS — BP 140/84 | HR 77 | Ht 73.0 in | Wt 197.5 lb

## 2017-06-09 DIAGNOSIS — E785 Hyperlipidemia, unspecified: Secondary | ICD-10-CM

## 2017-06-09 DIAGNOSIS — I2510 Atherosclerotic heart disease of native coronary artery without angina pectoris: Secondary | ICD-10-CM

## 2017-06-09 DIAGNOSIS — I255 Ischemic cardiomyopathy: Secondary | ICD-10-CM

## 2017-06-09 DIAGNOSIS — Z720 Tobacco use: Secondary | ICD-10-CM | POA: Diagnosis not present

## 2017-06-09 DIAGNOSIS — I10 Essential (primary) hypertension: Secondary | ICD-10-CM | POA: Diagnosis not present

## 2017-06-09 MED ORDER — LISINOPRIL 20 MG PO TABS: 20.0000 mg | ORAL_TABLET | Freq: Every day | ORAL | 3 refills | Status: DC

## 2017-06-09 NOTE — Progress Notes (Signed)
Cardiology Office Note   Date:  06/09/2017   ID:  Alphonzo SeveranceJeffrey W Clinard, DOB 07/20/1960, MRN [id number]  PCP:  Jaclyn Shaggyate, Denny C, MD  Cardiologist:   Lorine BearsMuhammad Liza Czerwinski, MD   Chief Complaint  Patient presents with  . other    12 month follow up. Meds reviewed by the pt. verbally. "doing well."       History of Present Illness: Ryan Bradshaw is a 56 y.o. male who presents for a follow-up visit regarding coronary artery disease. He has known history of poorly controlled type 2 diabetes, hypertension, tobacco use, hyperlipidemia, peripheral neuropathy, GERD and chronic back pain. [facility] of [facility] with inferior ST elevation myocardial infarction. Cardiac catheterization showed an occluded mid left circumflex with ejection fraction of 50%. He underwent successful angioplasty and drug-eluting stent placement to the mid left circumflex. There was an occluded small distal OM branch which was left to be treated medically. Echocardiogram showed an ejection fraction of 45% with grade 1 diastolic dysfunction.  Nuclear stress test in April 2007 showed evidence of prior inferolateral infarct without significant ischemia.  He has been doing well and denies any chest pain or shortness of breath.  Unfortunately, he continues to smoke half a pack per day.  He has been taking his medications regularly.   Past Medical History:  Diagnosis Date  . Depression   . DM2 (diabetes mellitus, type 2) (HCC)   . GERD (gastroesophageal reflux disease)   . HLD (hyperlipidemia)   . Hypertension   . Myocardial infarction (HCC)   . Neuropathy     Past Surgical History:  Procedure Laterality Date  . CARDIAC CATHETERIZATION N/A 11/27/2014   Procedure: Left Heart Cath and Coronary Angiography;  Surgeon: Iran OuchMuhammad A Nao Linz, MD;  Location: [facility] INVASIVE CV LAB;  Service: Cardiovascular;  Laterality: N/A;  . CARDIAC CATHETERIZATION N/A 11/27/2014   Procedure: Coronary Stent Intervention;  Surgeon: Iran OuchMuhammad A  Nikkita Adeyemi, MD;  Location: [facility] INVASIVE CV LAB;  Service: Cardiovascular;  Laterality: N/A;     Current Outpatient Medications  Medication Sig Dispense Refill  . ALPRAZolam (XANAX) 0.5 MG tablet Take 0.5 mg by mouth 2 (two) times daily as needed for anxiety.    Marland Kitchen. aspirin 81 MG chewable tablet Chew 1 tablet (81 mg total) by mouth daily. 30 tablet 11  . atorvastatin (LIPITOR) 80 MG tablet Take 1 tablet (80 mg total) by mouth daily at 6 PM. 30 tablet 11  . carvedilol (COREG) 6.25 MG tablet Take 1 tablet (6.25 mg total) by mouth 2 (two) times daily with a meal. 60 tablet 11  . clopidogrel (PLAVIX) 75 MG tablet Take 1 tablet (75 mg total) by mouth daily. 30 tablet 5  . gabapentin (NEURONTIN) 300 MG capsule Take 300 mg by mouth daily.    Marland Kitchen. lisinopril (PRINIVIL,ZESTRIL) 10 MG tablet Take 10 mg by mouth daily.    . metFORMIN (GLUCOPHAGE) 500 MG tablet Take 1,000 mg by mouth 2 (two) times daily.    . nitroGLYCERIN (NITROSTAT) 0.4 MG SL tablet Place 1 tab under tongue every 5 min prn chest pain, not to exceed 3 in 15 min 25 tablet 0  . ondansetron (ZOFRAN ODT) 4 MG disintegrating tablet Take 1 tablet (4 mg total) by mouth every 8 (eight) hours as needed for nausea or vomiting. 30 tablet 0  . oxyCODONE-acetaminophen (ROXICET) 5-325 MG tablet Take 1 tablet by mouth every 4 (four) hours as needed for severe pain. 30 tablet 0  .  tamsulosin (FLOMAX) 0.4 MG CAPS capsule Take 1 capsule (0.4 mg total) by mouth daily. 14 capsule 0  . sitaGLIPtin (JANUVIA) 100 MG tablet Take 100 mg by mouth daily.     No current facility-administered medications for this visit.     Allergies:   Contrast media [iodinated diagnostic agents] and Naproxen    Social History:  The patient  reports that he has been smoking cigarettes.  He has a 18.50 pack-year smoking history. he has never used smokeless tobacco. He reports that he drinks about 0.6 oz of alcohol per week. He reports that he does not use drugs.   Family History:  The  patient's family history includes Diabetes in his brother, brother, and father; Hyperlipidemia in his brother; Hypertension in his brother, father, and sister.    ROS:  Please see the history of present illness.   Otherwise, review of systems are positive for none.   All other systems are reviewed and negative.    PHYSICAL EXAM: VS:  BP 140/84 (BP Location: Left Arm, Patient Position: Sitting, Cuff Size: Normal)   Pulse 77   Ht 6\' 1"  (1.854 m)   Wt 197 lb 8 oz (89.6 kg)   BMI 26.06 kg/m  , BMI Body mass index is 26.06 kg/m. GEN: Well nourished, well developed, in no acute distress  HEENT: normal  Neck: no JVD, carotid bruits, or masses Cardiac: RRR; no murmurs, rubs, or gallops,no edema  Respiratory:  clear to auscultation bilaterally, normal work of breathing GI: soft, nontender, nondistended, + BS MS: no deformity or atrophy  Skin: warm and dry, no rash Neuro:  Strength and sensation are intact Psych: euthymic mood, full affect   EKG:  EKG is ordered today. The ekg ordered today demonstrates normal sinus rhythm with nonspecific T wave changes   Recent Labs: 06/27/2016: BUN 15; Creatinine, Ser 0.91; Hemoglobin 16.7; Platelets 202; Potassium 4.3; Sodium 137    Lipid Panel    Component Value Date/Time   CHOL 103 01/12/2015 0813   TRIG 156 (H) 01/12/2015 0813   HDL 21 (L) 01/12/2015 0813   CHOLHDL 4.9 01/12/2015 0813   CHOLHDL 7.9 11/28/2014 0642   VLDL 58 (H) 11/28/2014 0642   LDLCALC 51 01/12/2015 0813      Wt Readings from Last 3 Encounters:  06/09/17 197 lb 8 oz (89.6 kg)  07/14/16 188 lb 12.8 oz (85.6 kg)  06/27/16 192 lb (87.1 kg)        ASSESSMENT AND PLAN:  1.  Coronary artery disease involving native coronary arteries without angina:   He is doing very well overall with no anginal symptoms.  It has been more than 2 years since his myocardial infarction and stent placement.  Thus, I discontinued clopidogrel.  Continue aspirin indefinitely.  2.  Hyperlipidemia: Continue high-dose atorvastatin.  Most recent LDL was 51.  He gets labs done with his primary care physician.  3. Tobacco use: I again discussed with him the importance of smoking cessation.  4. Ischemic cardiomyopathy: Ejection fraction was 45% at the time of his myocardial infarction. Continue treatment with carvedilol and lisinopril.  5. Essential hypertension: Blood pressure has been elevated lately.  I increased lisinopril to 20 mg daily.    Disposition:   FU with me in 12 months  Signed,  Lorine BearsMuhammad Vaidehi Braddy, MD  06/09/2017 4:11 PM    [facility]

## 2017-06-09 NOTE — Patient Instructions (Signed)
Medication Instructions:  Your physician has recommended you make the following change in your medication:  STOP taking plavix INCREASE lisinopril to 20mg  once daily   Labwork: none  Testing/Procedures: none  Follow-Up: Your physician wants you to follow-up in: 1 year with Dr. Kirke CorinArida.  You will receive a reminder letter in the mail two months in advance. If you don't receive a letter, please call our office to schedule the follow-up appointment.   Any Other Special Instructions Will Be Listed Below (If Applicable).     If you need a refill on your cardiac medications before your next appointment, please call your pharmacy.

## 2017-07-10 IMAGING — CT CT RENAL STONE PROTOCOL
2 of 4 series · 16 of 46 positions shown, 18 images · non-contrast
Comparison: None.

CLINICAL DATA: Left flank pain, sudden onset tonight.

EXAM:
CT ABDOMEN AND PELVIS WITHOUT CONTRAST
TECHNIQUE: Multidetector CT imaging of the abdomen and pelvis was performed
following the standard protocol without IV contrast.

[Series 2: axial st · axial · 0.73mm/px · z∈[-1147,-617]mm · 13 of 116 slices shown, 15 images]
[im 5/116  soft-tissue]
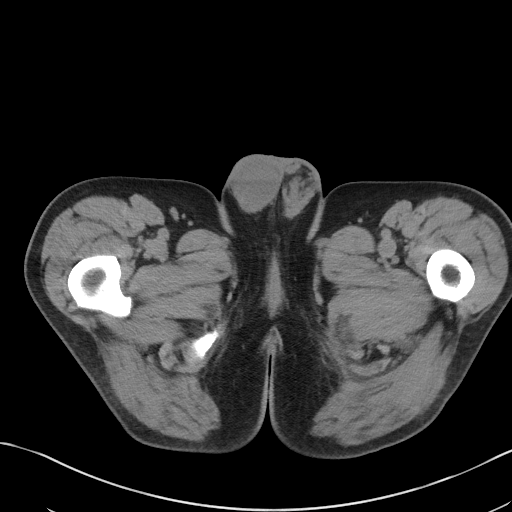
[im 5/116  bone]
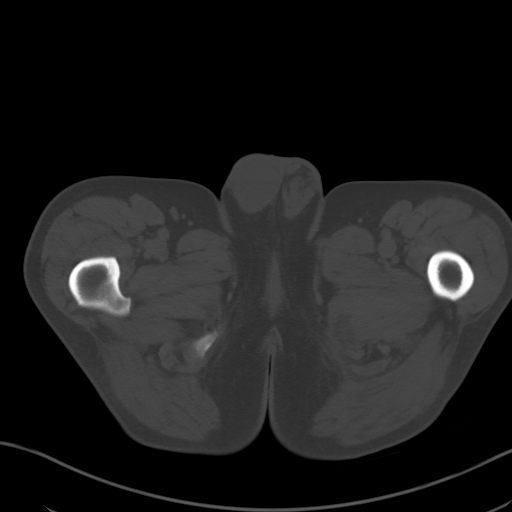
[im 15/116  soft-tissue]
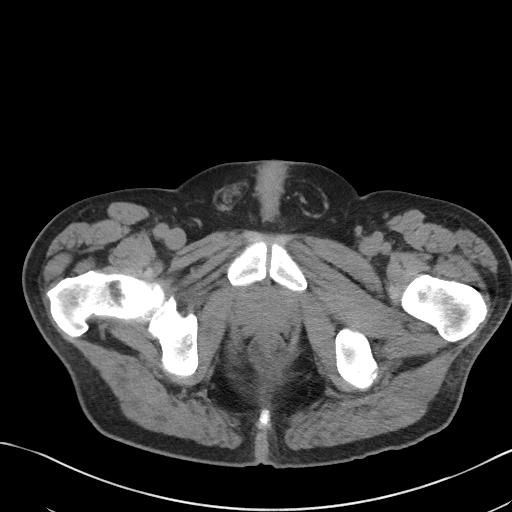
[im 24/116  soft-tissue]
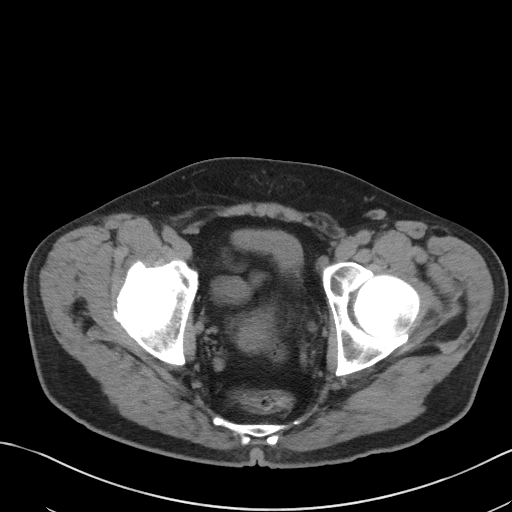
[im 34/116  soft-tissue]
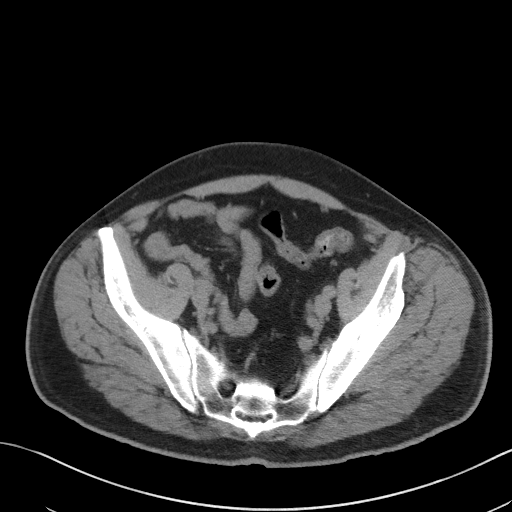
[im 39/116  soft-tissue]
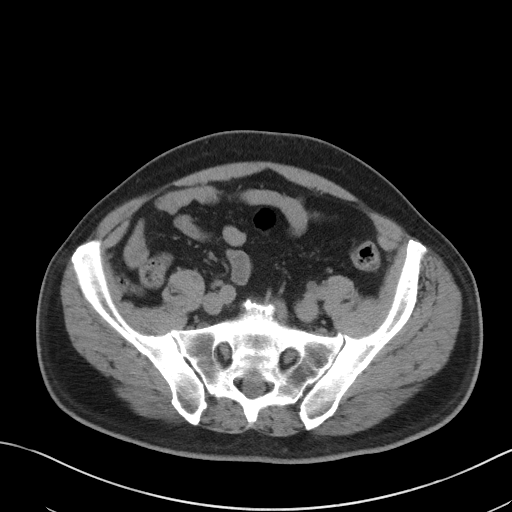
[im 48/116  soft-tissue]
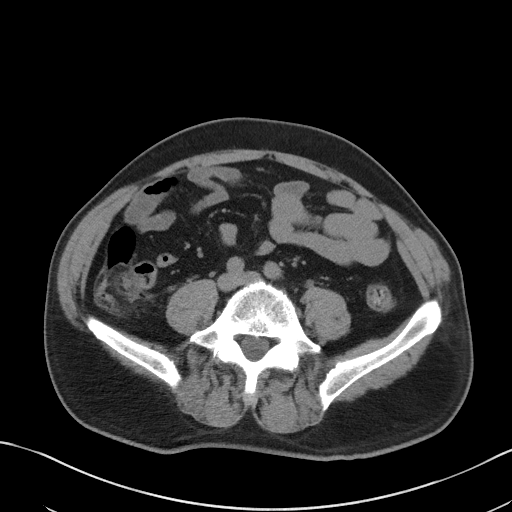
[im 58/116  soft-tissue]
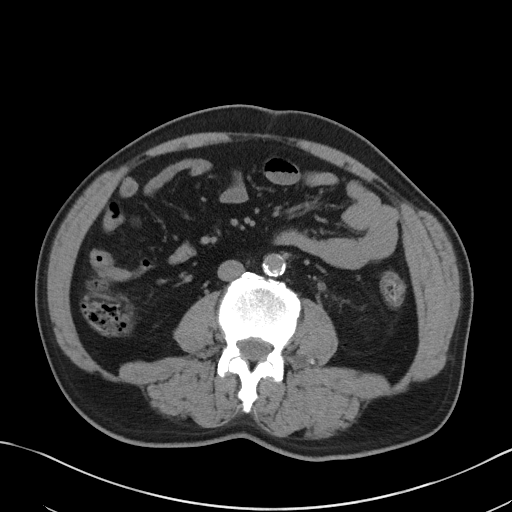
[im 68/116  soft-tissue]
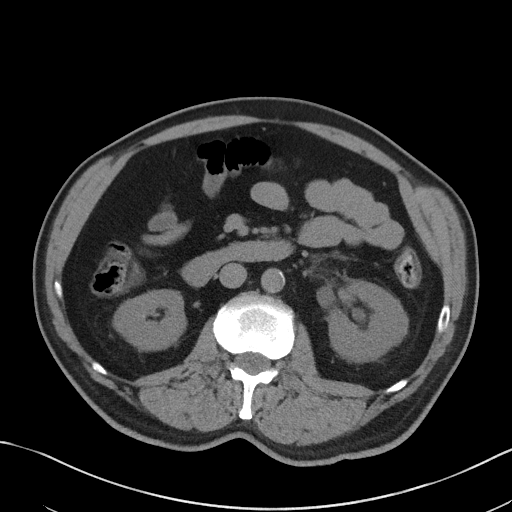
[im 77/116  soft-tissue]
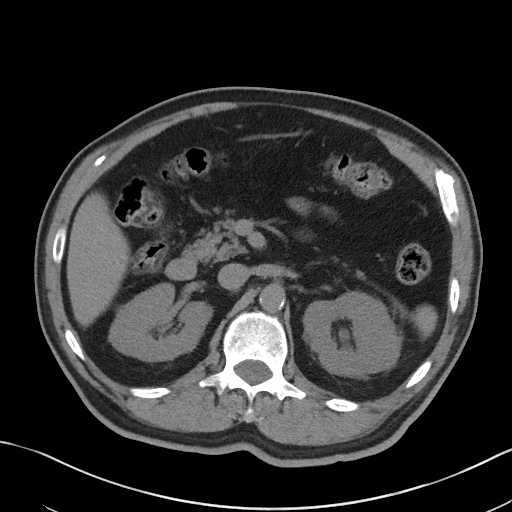
[im 77/116  bone]
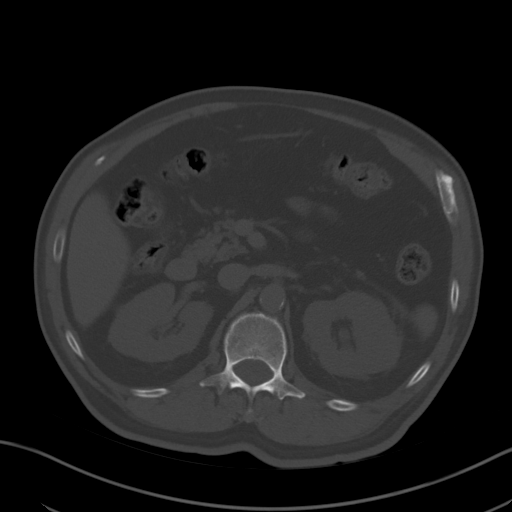
[im 82/116  soft-tissue]
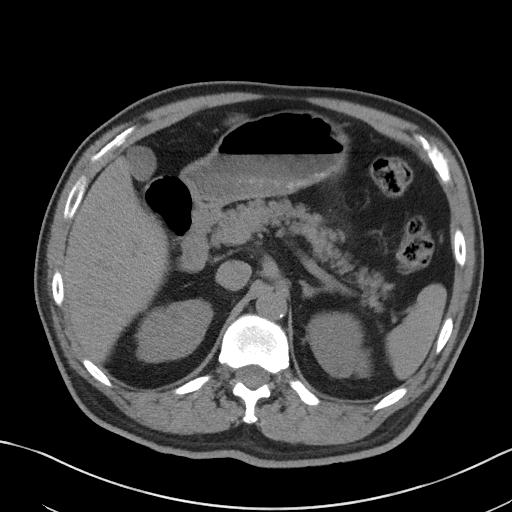
[im 92/116  soft-tissue]
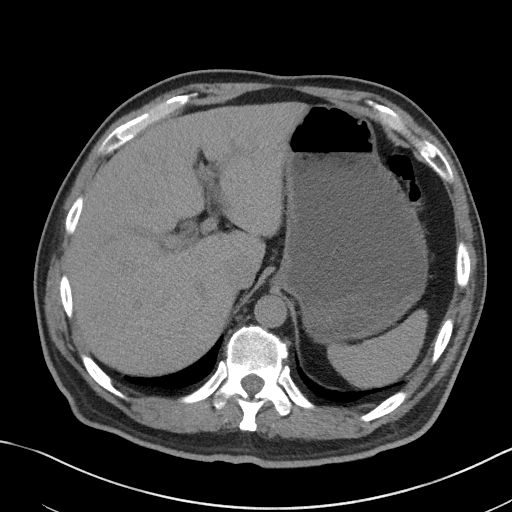
[im 101/116  soft-tissue]
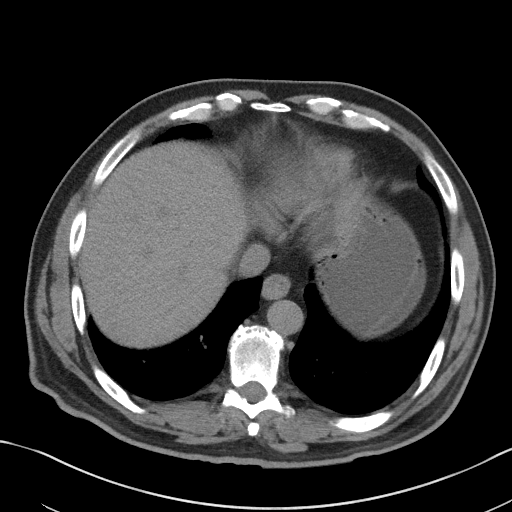
[im 111/116  soft-tissue]
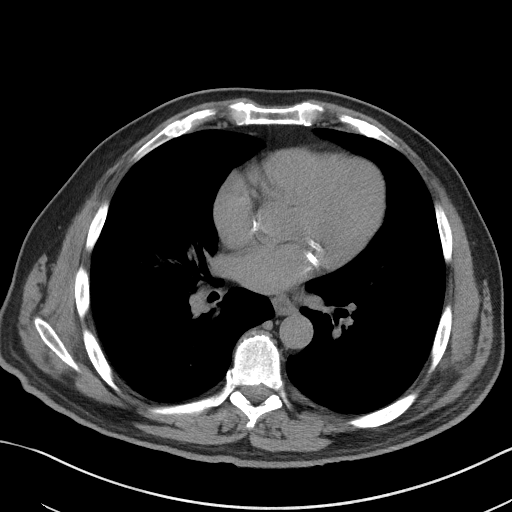

[Series 5: coronal · coronal · 0.80mm/px · 3 of 141 slices shown]
[im 47/141  soft-tissue]
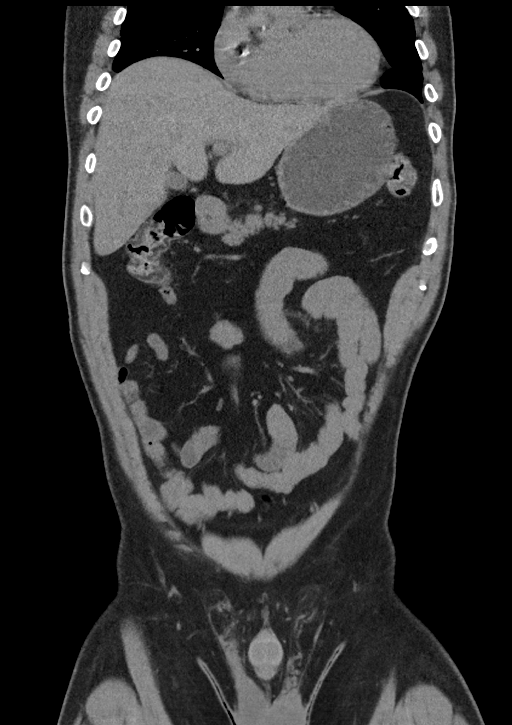
[im 63/141  soft-tissue]
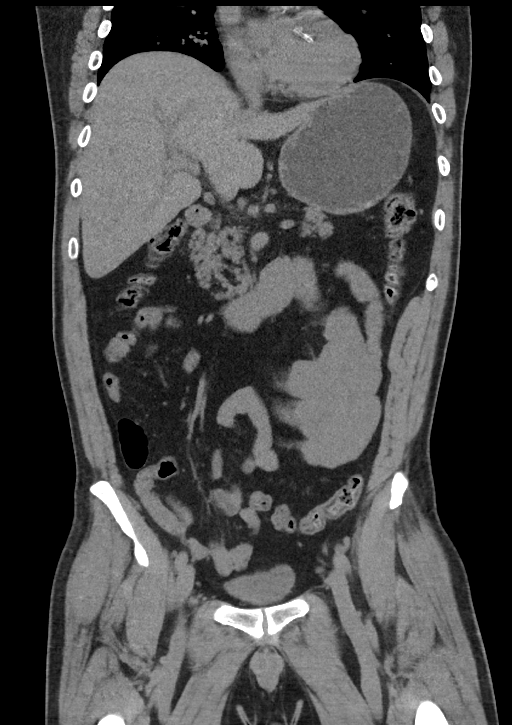
[im 78/141  soft-tissue]
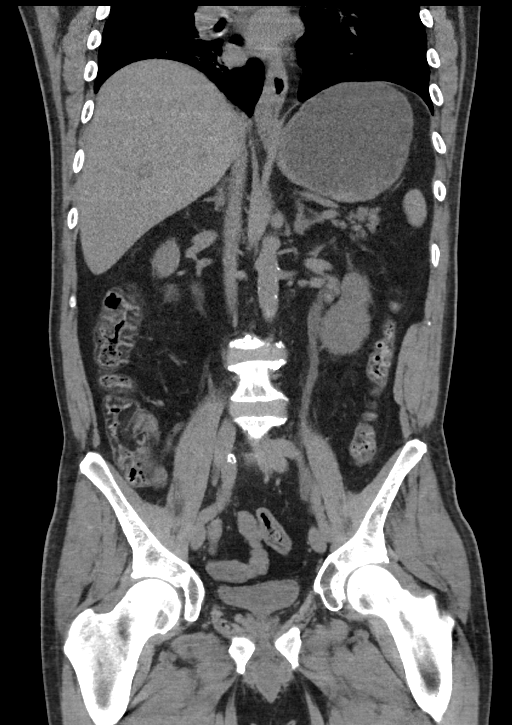

[16 of 46 positions shown; findings below may reference images not displayed]

FINDINGS: Lower chest: Minimal non consolidative opacities in the right lung
base, possibly atelectatic.

Hepatobiliary: No focal liver abnormality is seen. No gallstones,
gallbladder wall thickening, or biliary dilatation.

Pancreas: Unremarkable. No pancreatic ductal dilatation or
surrounding inflammatory changes.

Spleen: Normal in size without focal abnormality.

Adrenals/Urinary Tract: Both adrenals are normal. There is marked
hydronephrosis and hydroureter on the left due to an obstructing 4 x
6 mm calculus at the ureterovesical junction. Another 6 mm calculus
resides in the midpole right renal collecting system,
nonobstructing. No significant renal parenchymal lesions are
evident. Urinary bladder is unremarkable.

Stomach/Bowel: Stomach is within normal limits. Appendix is normal.
No evidence of bowel wall thickening, distention, or inflammatory
changes.

Vascular/Lymphatic: The abdominal aorta is normal in caliber with
moderate atherosclerotic calcification. No adenopathy.

Reproductive: Unremarkable

Other: No ascites

Musculoskeletal: No significant skeletal lesion.
IMPRESSION: Obstructing 4 x 6 mm left UVJ calculus with marked hydronephrosis
and hydroureter. Right nephrolithiasis.

## 2018-04-28 ENCOUNTER — Encounter: Payer: Self-pay | Admitting: Physician Assistant

## 2018-05-27 ENCOUNTER — Other Ambulatory Visit: Payer: Self-pay | Admitting: Cardiovascular Disease

## 2018-07-18 ENCOUNTER — Other Ambulatory Visit: Payer: Self-pay | Admitting: Cardiovascular Disease

## 2018-07-28 ENCOUNTER — Ambulatory Visit: Payer: Medicare HMO | Admitting: Nurse Practitioner

## 2018-08-12 ENCOUNTER — Ambulatory Visit: Payer: Medicare HMO | Admitting: Nurse Practitioner

## 2018-08-17 ENCOUNTER — Other Ambulatory Visit: Payer: Self-pay | Admitting: Cardiovascular Disease

## 2018-08-19 NOTE — Progress Notes (Signed)
Cardiology Office Note Date:  08/20/2018  Patient ID:  Ryan SeveranceJeffrey W Louison, DOB 10/17/1960, MRN [id number] PCP:  Jaclyn Shaggyate, Denny C, MD  Cardiologist:  Dr. Kirke CorinArida, MD    Chief Complaint: Follow up  History of Present Illness: Suzan NailerJeffrey W Mccullars is a 58 y.o. male with history of CAD, HFrEF secondary to ICM, poorly controlled diabetes with peripheral neuropathy, hypertension, hyperlipidemia, tobacco abuse, chronic back pain, and GERD who presents for follow-up of his CAD.  Patient was admitted to the hospital in 11/2014 with an inferior ST elevation MI.  Cath showed an occluded mid LCx with an EF of 50%.  He underwent successful PCI/DES to the mid LCx.  There was an occluded small distal OM branch that was left to be medically managed.  Echo showed an EF of 45% with grade 1 diastolic dysfunction.  Follow-up nuclear stress test in 10/2015 showed no significant ischemia with an EF of 35%.  This was read as a low risk stress test.  He was last seen in the office in 06/2017 and was doing well, though continued to smoke a half pack per day.  Labs: 04/2018 - BUN 12, serum creatinine 0.1, potassium 4.8, albumin 4.5, AST/ALT normal, TC 98, TG 155, HDL 30, LDL 37, A1c 9.9  Patient comes in doing quite well from a cardiac perspective.  No chest pain, shortness of breath, palpitations, dizziness, presyncope, or syncope.  He is tolerating all medications without issues.  Blood pressure remains well controlled at home in the 120s over 80s.  No falls since he was last seen.  No BRBPR or melena.  No lower extremity swelling, abdominal distention, orthopnea, PND, or early satiety.  He continues to smoke half pack daily and is not interested in tapering any further or quitting.   Past Medical History:  Diagnosis Date  . Depression   . DM2 (diabetes mellitus, type 2) (HCC)   . GERD (gastroesophageal reflux disease)   . HLD (hyperlipidemia)   . Hypertension   . Myocardial infarction (HCC)   . Neuropathy     Past  Surgical History:  Procedure Laterality Date  . CARDIAC CATHETERIZATION N/A 11/27/2014   Procedure: Left Heart Cath and Coronary Angiography;  Surgeon: Iran OuchMuhammad A Arida, MD;  Location: [facility] INVASIVE CV LAB;  Service: Cardiovascular;  Laterality: N/A;  . CARDIAC CATHETERIZATION N/A 11/27/2014   Procedure: Coronary Stent Intervention;  Surgeon: Iran OuchMuhammad A Arida, MD;  Location: [facility] INVASIVE CV LAB;  Service: Cardiovascular;  Laterality: N/A;    Current Meds  Medication Sig  . ALPRAZolam (XANAX) 0.5 MG tablet Take 0.5 mg by mouth 2 (two) times daily as needed for anxiety.  Marland Kitchen. aspirin 81 MG chewable tablet Chew 1 tablet (81 mg total) by mouth daily.  Marland Kitchen. atorvastatin (LIPITOR) 80 MG tablet Take 1 tablet (80 mg total) by mouth daily at 6 PM.  . carvedilol (COREG) 6.25 MG tablet Take 1 tablet (6.25 mg total) by mouth 2 (two) times daily with a meal.  . gabapentin (NEURONTIN) 300 MG capsule Take 300 mg by mouth daily.  Marland Kitchen. lisinopril (PRINIVIL,ZESTRIL) 20 MG tablet TAKE 1 TABLET BY MOUTH EVERY DAY  . metFORMIN (GLUCOPHAGE) 500 MG tablet Take 1,000 mg by mouth 2 (two) times daily.  . nitroGLYCERIN (NITROSTAT) 0.4 MG SL tablet Place 1 tab under tongue every 5 min prn chest pain, not to exceed 3 in 15 min  . ondansetron (ZOFRAN ODT) 4 MG disintegrating tablet Take 1 tablet (4 mg total) by mouth every 8 (eight)  hours as needed for nausea or vomiting.  Marland Kitchen oxyCODONE-acetaminophen (ROXICET) 5-325 MG tablet Take 1 tablet by mouth every 4 (four) hours as needed for severe pain.  . tamsulosin (FLOMAX) 0.4 MG CAPS capsule Take 1 capsule (0.4 mg total) by mouth daily.    Allergies:   Contrast media [iodinated diagnostic agents] and Naproxen   Social History:  The patient  reports that he has been smoking cigarettes. He has a 18.50 pack-year smoking history. He has never used smokeless tobacco. He reports current alcohol use of about 1.0 standard drinks of alcohol per week. He reports that he does not use drugs.    Family History:  The patient's family history includes Diabetes in his brother, brother, and father; Hyperlipidemia in his brother; Hypertension in his brother, father, and sister.  ROS:   Review of Systems  Constitutional: Negative for chills, diaphoresis, fever, malaise/fatigue and weight loss.  HENT: Negative for congestion.   Eyes: Negative for discharge and redness.  Respiratory: Negative for cough, hemoptysis, sputum production, shortness of breath and wheezing.   Cardiovascular: Negative for chest pain, palpitations, orthopnea, claudication, leg swelling and PND.  Gastrointestinal: Negative for abdominal pain, blood in stool, heartburn, melena, nausea and vomiting.  Genitourinary: Negative for hematuria.  Musculoskeletal: Positive for neck pain. Negative for falls and myalgias.  Skin: Negative for rash.  Neurological: Negative for dizziness, tingling, tremors, sensory change, speech change, focal weakness, loss of consciousness and weakness.  Endo/Heme/Allergies: Does not bruise/bleed easily.  Psychiatric/Behavioral: Negative for substance abuse. The patient is not nervous/anxious.   All other systems reviewed and are negative.    PHYSICAL EXAM:  VS:  BP 122/80 (BP Location: Left Arm, Patient Position: Sitting, Cuff Size: Normal)   Pulse 76   Ht 6\' 1"  (1.854 m)   Wt 194 lb (88 kg)   BMI 25.60 kg/m  BMI: Body mass index is 25.6 kg/m.  Physical Exam  Constitutional: He is oriented to person, place, and time. He appears well-developed and well-nourished.  HENT:  Head: Normocephalic and atraumatic.  Eyes: Right eye exhibits no discharge. Left eye exhibits no discharge.  Neck: Normal range of motion. No JVD present.  Cardiovascular: Normal rate, regular rhythm, S1 normal, S2 normal and normal heart sounds. Exam reveals no distant heart sounds, no friction rub, no midsystolic click and no opening snap.  No murmur heard. Pulses:      Posterior tibial pulses are 2+ on the  right side and 2+ on the left side.  Pulmonary/Chest: Effort normal and breath sounds normal. No respiratory distress. He has no decreased breath sounds. He has no wheezes. He has no rales. He exhibits no tenderness.  Abdominal: Soft. He exhibits no distension. There is no abdominal tenderness.  Musculoskeletal:        General: No edema.  Neurological: He is alert and oriented to person, place, and time.  Skin: Skin is warm and dry. No cyanosis. Nails show no clubbing.  Psychiatric: He has a normal mood and affect. His speech is normal and behavior is normal. Judgment and thought content normal.     EKG:  Was ordered and interpreted by me today. Shows NSR, 76 bpm, inferior lateral T wave inversion (unchanged from prior study)  Recent Labs: No results found for requested labs within last 8760 hours.  No results found for requested labs within last 8760 hours.   CrCl cannot be calculated (Patient's most recent lab result is older than the maximum 21 days allowed.).   Wt Readings  from Last 3 Encounters:  08/20/18 194 lb (88 kg)  06/09/17 197 lb 8 oz (89.6 kg)  07/14/16 188 lb 12.8 oz (85.6 kg)     Other studies reviewed: Additional studies/records reviewed today include: summarized above  ASSESSMENT AND PLAN:  1. CAD involving the native coronary arteries without angina: He is doing well without any symptoms concerning for angina.  It has been since 2015 when he underwent PCI last.  Continue aspirin 81 mg daily indefinitely.  He will remain on Coreg, lisinopril, and Lipitor as outlined below.  Aggressive risk factor modification including complete tobacco cessation and secondary prevention is recommended.  No plans for further ischemic evaluation at this time.  2. HFrEF secondary to ICM: He appears well compensated and euvolemic on exam today.  Remains on lisinopril and Coreg.  Check echocardiogram to trend EF.  CHF education provided.  3. Hypertension: Blood pressure is well  controlled today.  Continue current dose of Coreg and lisinopril.  4. Hyperlipidemia: Recent LDL of 37 from 04/2018 with normal liver function at that time.  Continue Lipitor 80 mg daily.  5. Tobacco abuse: Has been smoking half pack daily since his heart attack in 2015.  He is not interested in tapering any further or quitting.  Complete cessation is recommended.  Disposition: F/u with Dr. Kirke Corin or an APP in 6 months.  Current medicines are reviewed at length with the patient today.  The patient did not have any concerns regarding medicines.  Signed, Eula Listen, PA-C 08/20/2018 10:39 AM     [facility] [address] [place], [place] [address] [phone]

## 2018-08-20 ENCOUNTER — Encounter: Payer: Self-pay | Admitting: Physician Assistant

## 2018-08-20 ENCOUNTER — Ambulatory Visit: Payer: Medicare HMO | Admitting: Physician Assistant

## 2018-08-20 VITALS — BP 122/80 | HR 76 | Ht 73.0 in | Wt 194.0 lb

## 2018-08-20 DIAGNOSIS — I2510 Atherosclerotic heart disease of native coronary artery without angina pectoris: Secondary | ICD-10-CM

## 2018-08-20 DIAGNOSIS — I255 Ischemic cardiomyopathy: Secondary | ICD-10-CM

## 2018-08-20 DIAGNOSIS — Z720 Tobacco use: Secondary | ICD-10-CM

## 2018-08-20 DIAGNOSIS — E785 Hyperlipidemia, unspecified: Secondary | ICD-10-CM

## 2018-08-20 DIAGNOSIS — I10 Essential (primary) hypertension: Secondary | ICD-10-CM | POA: Diagnosis not present

## 2018-08-20 NOTE — Patient Instructions (Signed)
Medication Instructions:  Your physician recommends that you continue on your current medications as directed. Please refer to the Current Medication list given to you today.  If you need a refill on your cardiac medications before your next appointment, please call your pharmacy.   Lab work: None ordered  If you have labs (blood work) drawn today and your tests are completely normal, you will receive your results only by: Marland Kitchen MyChart Message (if you have MyChart) OR . A paper copy in the mail If you have any lab test that is abnormal or we need to change your treatment, we will call you to review the results.  Testing/Procedures: 1- Echo  Your physician has requested that you have an echocardiogram. Echocardiography is a painless test that uses sound waves to create images of your heart. It provides your doctor with information about the size and shape of your heart and how well your heart's chambers and valves are working. This procedure takes approximately one hour. There are no restrictions for this procedure.   Follow-Up: At [facility], you and your health needs are our priority.  As part of our continuing mission to provide you with exceptional heart care, we have created designated Provider Care Teams.  These Care Teams include your primary Cardiologist (physician) and Advanced Practice Providers (APPs -  Physician Assistants and Nurse Practitioners) who all work together to provide you with the care you need, when you need it. You will need a follow up appointment in 6 months.  Please call our office 2 months in advance to schedule this appointment.  You may see Dr. Kirke Corin or Eula Listen, PA-C

## 2018-09-14 ENCOUNTER — Other Ambulatory Visit: Payer: Self-pay | Admitting: Physician Assistant

## 2018-09-14 DIAGNOSIS — I2510 Atherosclerotic heart disease of native coronary artery without angina pectoris: Secondary | ICD-10-CM

## 2018-09-14 DIAGNOSIS — I252 Old myocardial infarction: Secondary | ICD-10-CM

## 2018-09-21 ENCOUNTER — Other Ambulatory Visit: Payer: Medicare HMO

## 2018-11-05 ENCOUNTER — Other Ambulatory Visit: Payer: Self-pay | Admitting: *Deleted

## 2018-11-05 MED ORDER — LISINOPRIL 20 MG PO TABS: 20.0000 mg | ORAL_TABLET | Freq: Every day | ORAL | 2 refills | Status: DC

## 2018-12-17 ENCOUNTER — Other Ambulatory Visit: Payer: Self-pay

## 2018-12-17 ENCOUNTER — Ambulatory Visit (INDEPENDENT_AMBULATORY_CARE_PROVIDER_SITE_OTHER): Payer: Medicare HMO

## 2018-12-17 DIAGNOSIS — I252 Old myocardial infarction: Secondary | ICD-10-CM | POA: Diagnosis not present

## 2018-12-17 DIAGNOSIS — I2510 Atherosclerotic heart disease of native coronary artery without angina pectoris: Secondary | ICD-10-CM

## 2019-08-18 ENCOUNTER — Ambulatory Visit: Payer: Medicare Other | Admitting: Cardiovascular Disease

## 2019-09-22 ENCOUNTER — Other Ambulatory Visit: Payer: Self-pay

## 2019-09-22 ENCOUNTER — Encounter: Payer: Self-pay | Admitting: Cardiovascular Disease

## 2019-09-22 ENCOUNTER — Ambulatory Visit (INDEPENDENT_AMBULATORY_CARE_PROVIDER_SITE_OTHER): Payer: Medicare HMO | Admitting: Cardiovascular Disease

## 2019-09-22 VITALS — BP 144/84 | HR 91 | Ht 73.0 in | Wt 190.5 lb

## 2019-09-22 DIAGNOSIS — I255 Ischemic cardiomyopathy: Secondary | ICD-10-CM | POA: Diagnosis not present

## 2019-09-22 DIAGNOSIS — I2510 Atherosclerotic heart disease of native coronary artery without angina pectoris: Secondary | ICD-10-CM

## 2019-09-22 DIAGNOSIS — E785 Hyperlipidemia, unspecified: Secondary | ICD-10-CM

## 2019-09-22 DIAGNOSIS — Z720 Tobacco use: Secondary | ICD-10-CM | POA: Diagnosis not present

## 2019-09-22 MED ORDER — LISINOPRIL 40 MG PO TABS: 40.0000 mg | ORAL_TABLET | Freq: Every day | ORAL | 3 refills | Status: DC

## 2019-09-22 NOTE — Progress Notes (Signed)
Cardiology Office Note   Date:  09/22/2019   ID:  Argus, Caraher March 30, 1961, MRN [id number]  PCP:  Jaclyn Shaggy, MD  Cardiologist:   Lorine Bears, MD   Chief Complaint  Patient presents with  . OTHER    6 month f/u no complaints today. Meds reviewed verbally with pt.      History of Present Illness: AJAX SCHROLL is a 59 y.o. male who presents for a follow-up visit regarding coronary artery disease. He has known history of poorly controlled type 2 diabetes, hypertension, tobacco use, hyperlipidemia, peripheral neuropathy, GERD and chronic back pain. He presented to [facility] in May of 2016 with inferior ST elevation myocardial infarction. Cardiac catheterization showed an occluded mid left circumflex with ejection fraction of 50%. He underwent successful angioplasty and drug-eluting stent placement to the mid left circumflex. There was an occluded small distal OM branch which was left to be treated medically. Echocardiogram showed an ejection fraction of 45% with grade 1 diastolic dysfunction.  Nuclear stress test in April 2017 showed evidence of prior inferolateral infarct without significant ischemia.  Most recent echocardiogram in June 2020 showed an EF of 45 to 50% with no significant valvular abnormalities. He has been doing well with no recent chest pain, shortness of breath or palpitations.  He cut down on tobacco use to 5 cigarettes a day.  Previous lower extremity arterial Doppler in 2017 was normal.   Past Medical History:  Diagnosis Date  . Depression   . DM2 (diabetes mellitus, type 2) (HCC)   . GERD (gastroesophageal reflux disease)   . HLD (hyperlipidemia)   . Hypertension   . Myocardial infarction (HCC)   . Neuropathy     Past Surgical History:  Procedure Laterality Date  . CARDIAC CATHETERIZATION N/A 11/27/2014   Procedure: Left Heart Cath and Coronary Angiography;  Surgeon: Iran Ouch, MD;  Location: [facility] INVASIVE CV LAB;  Service:  Cardiovascular;  Laterality: N/A;  . CARDIAC CATHETERIZATION N/A 11/27/2014   Procedure: Coronary Stent Intervention;  Surgeon: Iran Ouch, MD;  Location: [facility] INVASIVE CV LAB;  Service: Cardiovascular;  Laterality: N/A;     Current Outpatient Medications  Medication Sig Dispense Refill  . ALPRAZolam (XANAX) 0.5 MG tablet Take 0.5 mg by mouth 2 (two) times daily as needed for anxiety.    Marland Kitchen aspirin 81 MG chewable tablet Chew 1 tablet (81 mg total) by mouth daily. 30 tablet 11  . atorvastatin (LIPITOR) 80 MG tablet Take 1 tablet (80 mg total) by mouth daily at 6 PM. 30 tablet 11  . carvedilol (COREG) 6.25 MG tablet Take 1 tablet (6.25 mg total) by mouth 2 (two) times daily with a meal. 60 tablet 11  . gabapentin (NEURONTIN) 300 MG capsule Take 300 mg by mouth daily.    Marland Kitchen lisinopril (ZESTRIL) 20 MG tablet Take 1 tablet (20 mg total) by mouth daily. 30 tablet 2  . metFORMIN (GLUCOPHAGE) 500 MG tablet Take 1,000 mg by mouth 2 (two) times daily.    . nitroGLYCERIN (NITROSTAT) 0.4 MG SL tablet Place 1 tab under tongue every 5 min prn chest pain, not to exceed 3 in 15 min 25 tablet 0  . ondansetron (ZOFRAN ODT) 4 MG disintegrating tablet Take 1 tablet (4 mg total) by mouth every 8 (eight) hours as needed for nausea or vomiting. 30 tablet 0  . oxyCODONE-acetaminophen (ROXICET) 5-325 MG tablet Take 1 tablet by mouth every 4 (four) hours as needed for severe pain.  30 tablet 0  . sitaGLIPtin (JANUVIA) 100 MG tablet Take 100 mg by mouth daily.    . tamsulosin (FLOMAX) 0.4 MG CAPS capsule Take 1 capsule (0.4 mg total) by mouth daily. 14 capsule 0   No current facility-administered medications for this visit.    Allergies:   Contrast media [iodinated diagnostic agents] and Naproxen    Social History:  The patient  reports that he has been smoking cigarettes. He has a 18.50 pack-year smoking history. He has never used smokeless tobacco. He reports current alcohol use of about 1.0 standard drinks of  alcohol per week. He reports that he does not use drugs.   Family History:  The patient's family history includes Diabetes in his brother, brother, and father; Hyperlipidemia in his brother; Hypertension in his brother, father, and sister.    ROS:  Please see the history of present illness.   Otherwise, review of systems are positive for none.   All other systems are reviewed and negative.    PHYSICAL EXAM: VS:  BP (!) 144/84 (BP Location: Left Arm, Patient Position: Sitting, Cuff Size: Normal)   Pulse 91   Ht 6\' 1"  (1.854 m)   Wt 190 lb 8 oz (86.4 kg)   SpO2 97%   BMI 25.13 kg/m  , BMI Body mass index is 25.13 kg/m. GEN: Well nourished, well developed, in no acute distress  HEENT: normal  Neck: no JVD, carotid bruits, or masses Cardiac: RRR; no murmurs, rubs, or gallops,no edema  Respiratory:  clear to auscultation bilaterally, normal work of breathing GI: soft, nontender, nondistended, + BS MS: no deformity or atrophy  Skin: warm and dry, no rash Neuro:  Strength and sensation are intact Psych: euthymic mood, full affect   EKG:  EKG is ordered today. The ekg ordered today demonstrates normal sinus rhythm with nonspecific T wave changes   Recent Labs: No results found for requested labs within last 8760 hours.    Lipid Panel    Component Value Date/Time   CHOL 103 01/12/2015 0813   TRIG 156 (H) 01/12/2015 0813   HDL 21 (L) 01/12/2015 0813   CHOLHDL 4.9 01/12/2015 0813   CHOLHDL 7.9 11/28/2014 0642   VLDL 58 (H) 11/28/2014 0642   LDLCALC 51 01/12/2015 0813      Wt Readings from Last 3 Encounters:  09/22/19 190 lb 8 oz (86.4 kg)  08/20/18 194 lb (88 kg)  06/09/17 197 lb 8 oz (89.6 kg)        ASSESSMENT AND PLAN:  1.  Coronary artery disease involving native coronary arteries without angina:   He is doing very well overall with no anginal symptoms.  Continue aspirin indefinitely.  2. Hyperlipidemia: Continue high-dose atorvastatin.  Most recent LDL was 51.   He gets labs done with his primary care physician.  3. Tobacco use: I again discussed with him the importance of smoking cessation.  4. Ischemic cardiomyopathy: Most recent ejection fraction was 45 to 50%.  Continue carvedilol.  I increase lisinopril to 40 mg daily.  No evidence of volume overload.  5. Essential hypertension: Blood pressure has been elevated lately.  I increased lisinopril to 40 mg daily.  6.  COVID-19 education: The patient had questions about the vaccine as he is hesitant to take it.  I discussed this with him today.   Disposition:   FU with me in 12 months  Signed,  Kathlyn Sacramento, MD  09/22/2019 8:28 AM    [place]

## 2019-09-22 NOTE — Patient Instructions (Signed)
Medication Instructions:  Your physician has recommended you make the following change in your medication:   INCREASE Lisinopril to 40 mg daily. An Rx has been sent to your pharmacy.  *If you need a refill on your cardiac medications before your next appointment, please call your pharmacy*   Lab Work: None ordered If you have labs (blood work) drawn today and your tests are completely normal, you will receive your results only by: Marland Kitchen MyChart Message (if you have MyChart) OR . A paper copy in the mail If you have any lab test that is abnormal or we need to change your treatment, we will call you to review the results.   Testing/Procedures: None ordered   Follow-Up: At [facility], you and your health needs are our priority.  As part of our continuing mission to provide you with exceptional heart care, we have created designated Provider Care Teams.  These Care Teams include your primary Cardiologist (physician) and Advanced Practice Providers (APPs -  Physician Assistants and Nurse Practitioners) who all work together to provide you with the care you need, when you need it.  We recommend signing up for the patient portal called "MyChart".  Sign up information is provided on this After Visit Summary.  MyChart is used to connect with patients for Virtual Visits (Telemedicine).  Patients are able to view lab/test results, encounter notes, upcoming appointments, etc.  Non-urgent messages can be sent to your provider as well.   To learn more about what you can do with MyChart, go to [email or web address].    Your next appointment:   12 month(s)  The format for your next appointment:   In Person  Provider:    You may see Dr. Kirke Corin or one of the following Advanced Practice Providers on your designated Care Team:    Nicolasa Ducking, NP  Eula Listen, PA-C  Marisue Ivan, PA-C    Other Instructions N/A

## 2020-07-28 ENCOUNTER — Other Ambulatory Visit: Payer: Self-pay | Admitting: Cardiovascular Disease

## 2020-10-10 NOTE — Progress Notes (Deleted)
Cardiology Office Note:    Date:  10/10/2020   ID:  Ryan Bradshaw, DOB 1961/02/21, MRN [id number]  PCP:  Jaclyn Shaggy, MD  [facility] Cardiologist:  No primary care provider on file.  [facility] Electrophysiologist:  None   Referring MD: Jaclyn Shaggy, MD   Chief Complaint: 58-month follow-up  History of Present Illness:    Ryan Bradshaw is a 60 y.o. male with a hx of history of CAD, HFrEF secondary to ICM, poorly controlled diabetes with peripheral neuropathy, hypertension, hyperlipidemia, tobacco abuse, chronic back pain, GERD who presents for follow-up.  Patient was admitted 11/2014 with inferior STEMI.  Cath showed occluded mid left circumflex with an EF of 50%.  He underwent successful DES in the mid left circumflex.  There was an occluded small distal OM branch that was left to be medically managed.  Echo showed EF 45% with grade 1 diastolic dysfunction.  Follow-up nuclear stress test 01/08/2016 showed no significant ischemia with an EF of 35%.  This was read as low risk stress test.  Patient had an echo June 2020 showing EF 45 to 50% with no significant valvular abnormalities.  Patient was last seen 09/22/2019 and patient was stable from a cardiac standpoint.  CAD s/p m LCx stenting  Hyperlipidemia  Tobacco use  Ischemic cardiomyopathy  Hypertension  Past Medical History:  Diagnosis Date  . Depression   . DM2 (diabetes mellitus, type 2) (HCC)   . GERD (gastroesophageal reflux disease)   . HLD (hyperlipidemia)   . Hypertension   . Myocardial infarction (HCC)   . Neuropathy     Past Surgical History:  Procedure Laterality Date  . CARDIAC CATHETERIZATION N/A 11/27/2014   Procedure: Left Heart Cath and Coronary Angiography;  Surgeon: Iran Ouch, MD;  Location: [facility] INVASIVE CV LAB;  Service: Cardiovascular;  Laterality: N/A;  . CARDIAC CATHETERIZATION N/A 11/27/2014   Procedure: Coronary Stent Intervention;  Surgeon: Iran Ouch, MD;  Location:  [facility] INVASIVE CV LAB;  Service: Cardiovascular;  Laterality: N/A;    Current Medications: No outpatient medications have been marked as taking for the 10/12/20 encounter (Appointment) with Fransico Michael, Josiephine Simao H, PA-C.     Allergies:   Contrast media [iodinated diagnostic agents] and Naproxen   Social History   Socioeconomic History  . Marital status: Married    Spouse name: Not on file  . Number of children: Not on file  . Years of education: Not on file  . Highest education level: Not on file  Occupational History  . Not on file  Tobacco Use  . Smoking status: Current Every Day Smoker    Packs/day: 0.50    Years: 37.00    Pack years: 18.50    Types: Cigarettes    Last attempt to quit: 12/06/2014    Years since quitting: 5.8  . Smokeless tobacco: Never Used  Substance and Sexual Activity  . Alcohol use: Yes    Alcohol/week: 1.0 standard drink    Types: 1 Glasses of wine per week  . Drug use: No  . Sexual activity: Yes  Other Topics Concern  . Not on file  Social History Narrative  . Not on file   Social Determinants of Health   Financial Resource Strain: Not on file  Food Insecurity: Not on file  Transportation Needs: Not on file  Physical Activity: Not on file  Stress: Not on file  Social Connections: Not on file     Family History: The patient's ***family  history includes Diabetes in his brother, brother, and father; Hyperlipidemia in his brother; Hypertension in his brother, father, and sister.  ROS:   Please see the history of present illness.    *** All other systems reviewed and are negative.  EKGs/Labs/Other Studies Reviewed:    The following studies were reviewed today: Echo 12/25/2018 1. The left ventricle has mildly reduced systolic function, with an  ejection fraction of 45-50%. The cavity size was normal. Left ventricular  diastolic Doppler parameters are consistent with impaired relaxation. Left  ventrical global hypokinesis without  regional wall  motion abnormalities.  2. The right ventricle has normal systolic function. The cavity was  normal. There is no increase in right ventricular wall thickness.Unable to  estimate RVSP    Cardiac catheterization 11/2014  Prox RCA lesion, 20% stenosed.  Mid RCA lesion, 40% stenosed.  Prox LAD to Mid LAD lesion, 30% stenosed.  Dist LAD lesion, 40% stenosed.  Ost Ramus lesion, 40% stenosed.  1st Diag lesion, 60% stenosed.  Prox Cx to Mid Cx lesion, 100% stenosed. There is a 0% residual stenosis post intervention.  A drug-eluting stent was placed.   1. Severe one-vessel coronary artery disease with an occluded mid left circumflex which is the culprit for ST elevation myocardial infarction. 2. Mildly elevated left ventricular end-diastolic pressure and low normal LV systolic function. 3. Successful angioplasty and drug-eluting stent placement to the mid left circumflex. There was a small OM4 branch which continued to be occluded likely due to embolization. This branch was too small to rescue.  Recommendations: Dual antiplatelet therapy for at least one year. Smoking cessation is strongly advised.    EKG:  EKG is *** ordered today.  The ekg ordered today demonstrates ***  Recent Labs: No results found for requested labs within last 8760 hours.  Recent Lipid Panel    Component Value Date/Time   CHOL 103 01/12/2015 0813   TRIG 156 (H) 01/12/2015 0813   HDL 21 (L) 01/12/2015 0813   CHOLHDL 4.9 01/12/2015 0813   CHOLHDL 7.9 11/28/2014 0642   VLDL 58 (H) 11/28/2014 0642   LDLCALC 51 01/12/2015 0813     Risk Assessment/Calculations:   {Does this patient have ATRIAL FIBRILLATION?:[phone]}   Physical Exam:    VS:  There were no vitals taken for this visit.    Wt Readings from Last 3 Encounters:  09/22/19 190 lb 8 oz (86.4 kg)  08/20/18 194 lb (88 kg)  06/09/17 197 lb 8 oz (89.6 kg)     GEN: *** Well nourished, well developed in no acute distress HEENT:  Normal NECK: No JVD; No carotid bruits LYMPHATICS: No lymphadenopathy CARDIAC: ***RRR, no murmurs, rubs, gallops RESPIRATORY:  Clear to auscultation without rales, wheezing or rhonchi  ABDOMEN: Soft, non-tender, non-distended MUSCULOSKELETAL:  No edema; No deformity  SKIN: Warm and dry NEUROLOGIC:  Alert and oriented x 3 PSYCHIATRIC:  Normal affect   ASSESSMENT:    No diagnosis found. PLAN:    In order of problems listed above:  1. ***  Disposition: Follow up {follow up:15908} with ***   Shared Decision Making/Informed Consent   {Are you ordering a CV Procedure (e.g. stress test, cath, DCCV, TEE, etc)?   Press F2        :[id number]}    Signed, Jumanah Hynson David Stall, PA-C  10/10/2020 2:16 PM    [facility]

## 2020-10-12 ENCOUNTER — Ambulatory Visit: Payer: Medicare HMO | Admitting: Medical

## 2020-10-18 ENCOUNTER — Encounter: Payer: Self-pay | Admitting: Family

## 2020-10-18 ENCOUNTER — Other Ambulatory Visit: Payer: Self-pay

## 2020-10-18 ENCOUNTER — Ambulatory Visit: Payer: Medicare HMO | Admitting: Family

## 2020-10-18 VITALS — BP 130/88 | HR 86 | Ht 73.0 in | Wt 180.0 lb

## 2020-10-18 DIAGNOSIS — E785 Hyperlipidemia, unspecified: Secondary | ICD-10-CM | POA: Diagnosis not present

## 2020-10-18 DIAGNOSIS — I25118 Atherosclerotic heart disease of native coronary artery with other forms of angina pectoris: Secondary | ICD-10-CM

## 2020-10-18 DIAGNOSIS — E1165 Type 2 diabetes mellitus with hyperglycemia: Secondary | ICD-10-CM

## 2020-10-18 DIAGNOSIS — I255 Ischemic cardiomyopathy: Secondary | ICD-10-CM

## 2020-10-18 DIAGNOSIS — I10 Essential (primary) hypertension: Secondary | ICD-10-CM | POA: Diagnosis not present

## 2020-10-18 DIAGNOSIS — Z720 Tobacco use: Secondary | ICD-10-CM

## 2020-10-18 NOTE — Patient Instructions (Addendum)
Medication Instructions:  Continue your current medications.   *If you need a refill on your cardiac medications before your next appointment, please call your pharmacy*  Lab Work: None ordered today.  Testing/Procedures: Your EKG today was overall stable compared to previous.    Follow-Up: At [facility], you and your health needs are our priority.  As part of our continuing mission to provide you with exceptional heart care, we have created designated Provider Care Teams.  These Care Teams include your primary Cardiologist (physician) and Advanced Practice Providers (APPs -  Physician Assistants and Nurse Practitioners) who all work together to provide you with the care you need, when you need it.  We recommend signing up for the patient portal called "MyChart".  Sign up information is provided on this After Visit Summary.  MyChart is used to connect with patients for Virtual Visits (Telemedicine).  Patients are able to view lab/test results, encounter notes, upcoming appointments, etc.  Non-urgent messages can be sent to your provider as well.   To learn more about what you can do with MyChart, go to [email or web address].    Your next appointment:   6 month(s)  The format for your next appointment:   In Person  Provider:   You may see Lorine Bears, MD or one of the following Advanced Practice Providers on your designated Care Team:    Nicolasa Ducking, NP  Eula Listen, PA-C  Marisue Ivan, PA-C  Cadence Fransico Michael, [place]  Gillian Shields, NP  Other Instructions  Exercise recommendations: The American Heart Association recommends 150 minutes of moderate intensity exercise weekly. Try 30 minutes of moderate intensity exercise 4-5 times per week. This could include walking, jogging, or swimming.  Heart Healthy Diet Recommendations: A low-salt diet is recommended. Meats should be grilled, baked, or boiled. Avoid fried foods. Focus on lean protein sources like fish or  chicken with vegetables and fruits. The American Heart Association is a Chief Technology Officer!  American Heart Association Diet and Lifeystyle Recommendations

## 2020-10-18 NOTE — Progress Notes (Signed)
Office Visit    Patient Name: Ryan Bradshaw Date of Encounter: 10/18/2020  PCP:  Jaclyn Shaggy, MD   [facility]  Cardiologist:  Lorine Bears, MD  Advanced Practice Provider:  No care team member to display Electrophysiologist:  None   Chief Complaint    Ryan Bradshaw is a 60 y.o. male with a hx of coronary artery disease s/p inferior STEMI 11/2014 with DES to mid LCx, ischemic cardiomyopathy, , DM2, HTN, tobacco use, HLD, peripheral neuropathy, GERD, chronic back pain presents today for follow up of coronary artery disease.   Past Medical History    Past Medical History:  Diagnosis Date  . Depression   . DM2 (diabetes mellitus, type 2) (HCC)   . GERD (gastroesophageal reflux disease)   . HLD (hyperlipidemia)   . Hypertension   . Myocardial infarction (HCC)   . Neuropathy    Past Surgical History:  Procedure Laterality Date  . CARDIAC CATHETERIZATION N/A 11/27/2014   Procedure: Left Heart Cath and Coronary Angiography;  Surgeon: Iran Ouch, MD;  Location: [facility] INVASIVE CV LAB;  Service: Cardiovascular;  Laterality: N/A;  . CARDIAC CATHETERIZATION N/A 11/27/2014   Procedure: Coronary Stent Intervention;  Surgeon: Iran Ouch, MD;  Location: [facility] INVASIVE CV LAB;  Service: Cardiovascular;  Laterality: N/A;    Allergies  Allergies  Allergen Reactions  . Contrast Media [Iodinated Diagnostic Agents] Shortness Of Breath  . Naproxen Other (See Comments)    Reaction:  Blood in stools    History of Present Illness    Ryan Bradshaw is a 60 y.o. male with a hx of  coronary artery disease s/p inferior STEMI 11/2014 with DES to mid LCx, ischemic cardiomyopathy, , DM2, HTN, tobacco use, HLD, peripheral neuropathy, GERD, chronic back pain last seen 09/22/19.  His initial cardiac event occurred in May 2016 at which time he presented to [facility] with inferior STEMI. Cardiac cath with occluded mid LCx and EF 50%. Underwent angioplasty and DES  placement to LCx. He had an occluded small distal OM branch recommended to be treated medically. Echocardiogram with LVEF 45% and grade 1 diastolic dysfunction. Previous lower extremity arterial doppler in 2017 was unremarkable. Most recent echocardiogram June 2020 LVEF 45-50%, no significant valvular abnormalities.  At last clinic visit his Lisinopril was increased to 40mg  daily for optimization of ICM therapies and due to hypertension.   Labs via PCP 04/2020:  Creatinine 0.77, GFR 101, NA 138, K5.0, ALT 31, AST 18  Total cholesterol 109, triglycerides 163, HDL 37, LDL 45  A1c 9.5  He presents today for follow up. Reports no shortness of breath at rest and stable dyspnea on exertion. Reports no chest pain, pressure, or tightness. No edema, orthopnea, PND. Reports no palpitations, lightheadedness, dizziness, near syncope, nor syncope. He is overall sedentary though enjoys spending time with his grandkids who are 12 years old to 31 years old. He eats mostly at home but does endorse eating many processed or pre-prepared foods.   EKGs/Labs/Other Studies Reviewed:   The following studies were reviewed today:  Echo 12/2018  1. The left ventricle has mildly reduced systolic function, with an  ejection fraction of 45-50%. The cavity size was normal. Left ventricular  diastolic Doppler parameters are consistent with impaired relaxation. Left  ventrical global hypokinesis without   regional wall motion abnormalities.   2. The right ventricle has normal systolic function. The cavity was  normal. There is no increase in right ventricular wall  thickness.Unable to  estimate RVSP   EKG:  EKG is ordered today.  The ekg ordered today demonstrates SR 86 bpm with TWI in leads II-III.  Recent Labs: No results found for requested labs within last 8760 hours.  Recent Lipid Panel    Component Value Date/Time   CHOL 103 01/12/2015 0813   TRIG 156 (H) 01/12/2015 0813   HDL 21 (L) 01/12/2015 0813    CHOLHDL 4.9 01/12/2015 0813   CHOLHDL 7.9 11/28/2014 0642   VLDL 58 (H) 11/28/2014 0642   LDLCALC 51 01/12/2015 0813   Medications:  Current Meds  Medication Sig  . ALPRAZolam (XANAX) 0.5 MG tablet Take 0.5 mg by mouth 2 (two) times daily as needed for anxiety.  Marland Kitchen aspirin 81 MG chewable tablet Chew 1 tablet (81 mg total) by mouth daily.  Marland Kitchen atorvastatin (LIPITOR) 80 MG tablet Take 1 tablet (80 mg total) by mouth daily at 6 PM.  . carvedilol (COREG) 6.25 MG tablet Take 1 tablet (6.25 mg total) by mouth 2 (two) times daily with a meal.  . gabapentin (NEURONTIN) 300 MG capsule Take 300 mg by mouth daily.  Marland Kitchen lisinopril (ZESTRIL) 40 MG tablet TAKE 1 TABLET BY MOUTH EVERY DAY  . metFORMIN (GLUCOPHAGE) 500 MG tablet Take 1,000 mg by mouth 2 (two) times daily.  . nitroGLYCERIN (NITROSTAT) 0.4 MG SL tablet Place 1 tab under tongue every 5 min prn chest pain, not to exceed 3 in 15 min     Review of Systems  All other systems reviewed and are otherwise negative except as noted above.  Physical Exam    VS:  BP 130/88 (BP Location: Left Arm, Patient Position: Sitting, Cuff Size: Normal)   Pulse 86   Ht 6\' 1"  (1.854 m)   Wt 180 lb (81.6 kg)   SpO2 95%   BMI 23.75 kg/m  , BMI Body mass index is 23.75 kg/m.  Wt Readings from Last 3 Encounters:  10/18/20 180 lb (81.6 kg)  09/22/19 190 lb 8 oz (86.4 kg)  08/20/18 194 lb (88 kg)    GEN: Well nourished, well developed, in no acute distress. HEENT: normal. Neck: Supple, no JVD, carotid bruits, or masses. Cardiac: RRR, no murmurs, rubs, or gallops. No clubbing, cyanosis, edema.  Radials/PT 2+ and equal bilaterally.  Respiratory:  Respirations regular and unlabored, clear to auscultation bilaterally. GI: Soft, nontender, nondistended. MS: No deformity or atrophy. Skin: Warm and dry, no rash. Neuro:  Strength and sensation are intact. Psych: Normal affect.  Assessment & Plan    1. CAD -EKG today sinus rhythm with T wave inversions in lead  II, 3.  These are present on previous EKGs and consistent with prior inferior STEMI.  He reports no chest pain, pressure, tightness.  Reports dyspnea on exertion is stable at his baseline which is likely multifactorial ischemic cardiomyopathy, tobacco use, deconditioning.  No indication for ischemic evaluation at this time.  GDMT includes aspirin, atorvastatin, coreg, PRN nitroglycerin.   2. ICM -Euvolemic and well compensated on exam.  Due to dyspnea on exertion which is likely multifactorial deconditioning.  GDMT includes Coreg, lisinopril.  No indication for loop diuretic at this time.  Discussed repeat echocardiogram for monitoring of LVEF and consideration of increased medical therapy which she politely declines today.  Low-salt diet and regular cardiovascular exercise encouraged  3. HLD, LDL goal <70 - 04/2020 LDL 45, AST 18, ALT 31 with PCP.  Continue atorvastatin 80 mg daily.  4. HTN - BP well controlled. Continue  current antihypertensive regimen including Coreg 6.25 mg twice daily, lisinopril 40 mg daily.   5. Tobacco use -Not interested in quitting.  Smoking cessation encouraged. Recommend utilization of 1800QUITNOW.  6. DM2 -poorly controlled with most recent A1c 9.5.  Continue to follow with primary care provider.  May benefit from addition of SGLT2 but will defer to primary care provider.  Disposition: Follow up in 6 month(s) with Dr. Kirke Corin or APP   Signed, Alver Sorrow, NP 10/18/2020, 9:02 AM [facility]

## 2021-07-01 ENCOUNTER — Other Ambulatory Visit: Payer: Self-pay | Admitting: Cardiovascular Disease

## 2021-07-02 NOTE — Telephone Encounter (Signed)
Please schedule overdue F/U appointment for refills. Thank you! 

## 2021-08-02 ENCOUNTER — Ambulatory Visit: Payer: Medicare HMO | Admitting: Cardiovascular Disease

## 2021-09-14 ENCOUNTER — Other Ambulatory Visit: Payer: Self-pay | Admitting: Cardiovascular Disease

## 2021-10-29 LAB — LAB REPORT - SCANNED
A1c: 9
EGFR: 105

## 2021-10-31 ENCOUNTER — Ambulatory Visit: Payer: Medicare HMO | Admitting: Cardiovascular Disease

## 2021-10-31 ENCOUNTER — Encounter: Payer: Self-pay | Admitting: Cardiovascular Disease

## 2021-10-31 VITALS — BP 158/60 | HR 87 | Ht 73.0 in | Wt 181.4 lb

## 2021-10-31 DIAGNOSIS — Z720 Tobacco use: Secondary | ICD-10-CM

## 2021-10-31 DIAGNOSIS — I2510 Atherosclerotic heart disease of native coronary artery without angina pectoris: Secondary | ICD-10-CM | POA: Diagnosis not present

## 2021-10-31 DIAGNOSIS — E785 Hyperlipidemia, unspecified: Secondary | ICD-10-CM

## 2021-10-31 DIAGNOSIS — I255 Ischemic cardiomyopathy: Secondary | ICD-10-CM | POA: Diagnosis not present

## 2021-10-31 DIAGNOSIS — I10 Essential (primary) hypertension: Secondary | ICD-10-CM

## 2021-10-31 MED ORDER — CARVEDILOL 12.5 MG PO TABS: 12.5000 mg | ORAL_TABLET | Freq: Two times a day (BID) | ORAL | 3 refills | Status: DC

## 2021-10-31 NOTE — Patient Instructions (Signed)
Medication Instructions:  ?Your physician has recommended you make the following change in your medication:  ? ?INCREASE Carvedilol to 12.5 mg twice a day. ? ? ?*If you need a refill on your cardiac medications before your next appointment, please call your pharmacy* ? ? ?Lab Work: ?None ordered ? ?If you have labs (blood work) drawn today and your tests are completely normal, you will receive your results only by: ?MyChart Message (if you have MyChart) OR ?A paper copy in the mail ?If you have any lab test that is abnormal or we need to change your treatment, we will call you to review the results. ? ? ?Testing/Procedures: ?None ordered ? ? ?Follow-Up: ?At [facility], you and your health needs are our priority.  As part of our continuing mission to provide you with exceptional heart care, we have created designated Provider Care Teams.  These Care Teams include your primary Cardiologist (physician) and Advanced Practice Providers (APPs -  Physician Assistants and Nurse Practitioners) who all work together to provide you with the care you need, when you need it. ? ?We recommend signing up for the patient portal called "MyChart".  Sign up information is provided on this After Visit Summary.  MyChart is used to connect with patients for Virtual Visits (Telemedicine).  Patients are able to view lab/test results, encounter notes, upcoming appointments, etc.  Non-urgent messages can be sent to your provider as well.   ?To learn more about what you can do with MyChart, go to [email or web address].   ? ?Your next appointment:   ?Your physician wants you to follow-up in: 1 year You will receive a reminder letter in the mail two months in advance. If you don't receive a letter, please call our office to schedule the follow-up appointment. ? ? ?The format for your next appointment:   ?In Person ? ?Provider:   ?You may see Kathlyn Sacramento, MD or one of the following Advanced Practice Providers on your designated Care  Team:   ?Murray Hodgkins, NP ?Christell Faith, PA-C ?Cadence Kathlen Mody, PA-C ? ? ?Other Instructions ?N/A ? ?Important Information About Sugar ? ? ? ? ? ? ?

## 2021-10-31 NOTE — Progress Notes (Signed)
?  ?Cardiology Office Note ? ? ?Date:  10/31/2021  ? ?ID:  Ryan Bradshaw, DOB 1961/03/08, MRN [id number] ? ?PCP:  Jaclyn Shaggy, MD  ?Cardiologist:   Lorine Bears, MD  ? ?Chief Complaint  ?Patient presents with  ? Other  ?  6 month f/u c/o eell. Meds reviewed verbally with pt.  ? ? ?  ?History of Present Illness: ?Ryan Bradshaw is a 61 y.o. male who presents for a follow-up visit regarding coronary artery disease. He has known history of poorly controlled type 2 diabetes, hypertension, tobacco use, hyperlipidemia, peripheral neuropathy, GERD and chronic back pain. He presented to [facility] in May of 2016 with inferior ST elevation myocardial infarction. Cardiac catheterization showed an occluded mid left circumflex with ejection fraction of 50%. He underwent successful angioplasty and drug-eluting stent placement to the mid left circumflex. There was an occluded small distal OM branch which was left to be treated medically. Echocardiogram showed an ejection fraction of 45% with grade 1 diastolic dysfunction.  ?Nuclear stress test in April 2017 showed evidence of prior inferolateral infarct without significant ischemia. ? ?Most recent echocardiogram in June 2020 showed an EF of 45 to 50% with no significant valvular abnormalities. ? ?Previous lower extremity arterial Doppler in 2017 was normal. ? ?He continues to smoke 10 cigarettes daily.  He denies any chest pain or shortness of breath.  He takes his medications regularly.  Blood pressure tends to run on the high side. ? ?Past Medical History:  ?Diagnosis Date  ? Depression   ? DM2 (diabetes mellitus, type 2) (HCC)   ? GERD (gastroesophageal reflux disease)   ? HLD (hyperlipidemia)   ? Hypertension   ? Myocardial infarction [facility])   ? Neuropathy   ? ? ?Past Surgical History:  ?Procedure Laterality Date  ? CARDIAC CATHETERIZATION N/A 11/27/2014  ? Procedure: Left Heart Cath and Coronary Angiography;  Surgeon: Iran Ouch, MD;  Location: [facility] INVASIVE CV LAB;   Service: Cardiovascular;  Laterality: N/A;  ? CARDIAC CATHETERIZATION N/A 11/27/2014  ? Procedure: Coronary Stent Intervention;  Surgeon: Iran Ouch, MD;  Location: [facility] INVASIVE CV LAB;  Service: Cardiovascular;  Laterality: N/A;  ? ? ? ?Current Outpatient Medications  ?Medication Sig Dispense Refill  ? ALPRAZolam (XANAX) 0.5 MG tablet Take 0.5 mg by mouth 2 (two) times daily as needed for anxiety.    ? aspirin 81 MG chewable tablet Chew 1 tablet (81 mg total) by mouth daily. 30 tablet 11  ? atorvastatin (LIPITOR) 80 MG tablet Take 1 tablet (80 mg total) by mouth daily at 6 PM. 30 tablet 11  ? gabapentin (NEURONTIN) 300 MG capsule Take 300 mg by mouth daily.    ? Insulin Degludec (TRESIBA) 100 UNIT/ML SOLN Inject 14 Units into the skin at bedtime.    ? lisinopril (ZESTRIL) 40 MG tablet TAKE 1 TABLET BY MOUTH EVERY DAY 90 tablet 0  ? metFORMIN (GLUCOPHAGE) 500 MG tablet Take 1,000 mg by mouth 2 (two) times daily.    ? nitroGLYCERIN (NITROSTAT) 0.4 MG SL tablet Place 1 tab under tongue every 5 min prn chest pain, not to exceed 3 in 15 min 25 tablet 0  ? carvedilol (COREG) 12.5 MG tablet Take 1 tablet (12.5 mg total) by mouth 2 (two) times daily with a meal. 180 tablet 3  ? ?No current facility-administered medications for this visit.  ? ? ?Allergies:   Contrast media [iodinated contrast media] and Naproxen  ? ? ?Social History:  The patient  reports that he has been smoking cigarettes. He has a 18.50 pack-year smoking history. He has never used smokeless tobacco. He reports current alcohol use of about 1.0 standard drink per week. He reports that he does not use drugs.  ? ?Family History:  The patient's family history includes Diabetes in his brother, brother, and father; Hyperlipidemia in his brother; Hypertension in his brother, father, and sister.  ? ? ?ROS:  Please see the history of present illness.   Otherwise, review of systems are positive for none.   All other systems are reviewed and negative.   ? ? ?PHYSICAL EXAM: ?VS:  BP (!) 158/60 (BP Location: Left Arm, Patient Position: Sitting, Cuff Size: Normal)   Pulse 87   Ht 6\' 1"  (1.854 m)   Wt 181 lb 6 oz (82.3 kg)   SpO2 98%   BMI 23.93 kg/m?  , BMI Body mass index is 23.93 kg/m?. ?GEN: Well nourished, well developed, in no acute distress  ?HEENT: normal  ?Neck: no JVD, carotid bruits, or masses ?Cardiac: RRR; no murmurs, rubs, or gallops,no edema  ?Respiratory:  clear to auscultation bilaterally, normal work of breathing ?GI: soft, nontender, nondistended, + BS ?MS: no deformity or atrophy  ?Skin: warm and dry, no rash ?Neuro:  Strength and sensation are intact ?Psych: euthymic mood, full affect ? ? ?EKG:  EKG is ordered today. ?The ekg ordered today demonstrates normal sinus rhythm with minimal LVH and left axis deviation.  Inferior T wave changes suggestive of ischemia. ? ? ?Recent Labs: ?No results found for requested labs within last 8760 hours.  ? ? ?Lipid Panel ?   ?Component Value Date/Time  ? CHOL 103 01/12/2015 0813  ? TRIG 156 (H) 01/12/2015 0813  ? HDL 21 (L) 01/12/2015 0813  ? CHOLHDL 4.9 01/12/2015 0813  ? CHOLHDL 7.9 11/28/2014 0642  ? VLDL 58 (H) 11/28/2014 11/30/2014  ? LDLCALC 51 01/12/2015 0813  ? ?  ? ?Wt Readings from Last 3 Encounters:  ?10/31/21 181 lb 6 oz (82.3 kg)  ?10/18/20 180 lb (81.6 kg)  ?09/22/19 190 lb 8 oz (86.4 kg)  ?  ? ? ? ? ?ASSESSMENT AND PLAN: ? ?1.  Coronary artery disease involving native coronary arteries without angina:   He is doing very well overall with no anginal symptoms.  Continue aspirin indefinitely. ? ?2. Hyperlipidemia: Continue high-dose atorvastatin.  Most recent lipid profile showed an LDL of 45. ? ?3. Tobacco use: I again discussed with him the importance of smoking cessation. ? ?4. Ischemic cardiomyopathy: Most recent ejection fraction was 45 to 50%.  Continue carvedilol and lisinopril.  No evidence of volume overload without any diuretics. ? ?5. Essential hypertension: Blood pressure is not well  controlled and his EKG is starting to show LVH.  I increase carvedilol to 12.5 mg twice daily. ? ? ? ?Disposition:   FU with me in 12 months ? ?Signed, ? ?09/24/19, MD  ?10/31/2021 9:15 AM    ?[facility] ?

## 2021-11-28 ENCOUNTER — Other Ambulatory Visit: Payer: Self-pay | Admitting: Cardiovascular Disease

## 2022-03-06 LAB — LAB REPORT - SCANNED
A1c: 9.6
EGFR: 98

## 2022-06-28 LAB — LAB REPORT - SCANNED
A1c: 8.9
Albumin, Urine POC: 1005.8
Creatinine, POC: 125.6 mg/dL
EGFR: 101
Microalb Creat Ratio: 801

## 2022-07-09 ENCOUNTER — Other Ambulatory Visit: Payer: Self-pay | Admitting: Cardiovascular Disease

## 2022-09-07 ENCOUNTER — Other Ambulatory Visit: Payer: Self-pay | Admitting: Cardiovascular Disease

## 2022-09-07 DIAGNOSIS — I2510 Atherosclerotic heart disease of native coronary artery without angina pectoris: Secondary | ICD-10-CM

## 2022-09-21 ENCOUNTER — Other Ambulatory Visit: Payer: Self-pay | Admitting: Cardiovascular Disease

## 2022-09-22 NOTE — Telephone Encounter (Signed)
Please schedule 12 month F/U appointment for further refills. Thank you! 

## 2022-10-05 ENCOUNTER — Other Ambulatory Visit: Payer: Self-pay | Admitting: Cardiovascular Disease

## 2022-10-05 DIAGNOSIS — I2510 Atherosclerotic heart disease of native coronary artery without angina pectoris: Secondary | ICD-10-CM

## 2022-10-06 NOTE — Telephone Encounter (Signed)
Good Morning,  Could you schedule this patient a 12 month follow up visit. The patient was last seen by Dr. Fletcher Anon 10-31-21. Thank you so much.

## 2022-10-13 NOTE — Telephone Encounter (Signed)
Left voicemail to schedule appointment 

## 2022-10-14 ENCOUNTER — Other Ambulatory Visit: Payer: Self-pay | Admitting: Cardiovascular Disease

## 2022-10-21 NOTE — Telephone Encounter (Signed)
Patient scheduled for 5/24

## 2022-10-25 LAB — LAB REPORT - SCANNED
A1c: 9.2
Albumin, Urine POC: 1026.5
Creatinine, POC: 111.5 mg/dL
EGFR: 103
Microalb Creat Ratio: 921

## 2022-11-28 ENCOUNTER — Ambulatory Visit: Payer: Medicare Other | Admitting: Cardiovascular Disease

## 2022-12-25 ENCOUNTER — Other Ambulatory Visit: Payer: Self-pay | Admitting: Cardiovascular Disease

## 2022-12-25 DIAGNOSIS — I2510 Atherosclerotic heart disease of native coronary artery without angina pectoris: Secondary | ICD-10-CM

## 2023-02-18 NOTE — Progress Notes (Unsigned)
Cardiology Office Note   Date:  02/19/2023   ID:  [name], [name] 1960/12/22, MRN [id number]  PCP:  Jaclyn Shaggy, MD  Cardiologist:   Lorine Bears, MD   Chief Complaint  Patient presents with   Follow-up    12 month f/u no complaints today. Meds reviewed verbally with pt.      History of Present Illness: Ryan Bradshaw is a 61 y.o. male who presents for a follow-up visit regarding coronary artery disease. He has known history of poorly controlled type 2 diabetes, hypertension, tobacco use, hyperlipidemia, peripheral neuropathy, GERD and chronic back pain. He presented to [facility] in May of 2016 with inferior ST elevation myocardial infarction. Cardiac catheterization showed an occluded mid left circumflex with ejection fraction of 50%. He underwent successful angioplasty and drug-eluting stent placement to the mid left circumflex. There was an occluded small distal OM branch which was left to be treated medically. Echocardiogram showed an ejection fraction of 45% with grade 1 diastolic dysfunction.  Nuclear stress test in April 2017 showed evidence of prior inferolateral infarct without significant ischemia.  Most recent echocardiogram in June 2020 showed an EF of 45 to 50% with no significant valvular abnormalities.  Previous lower extremity arterial Doppler in 2017 was normal.  He continues to smoke 10 cigarettes daily.    Has been doing well with no chest pain, shortness of breath or palpitations.  He takes his medications regularly and follows up with Dr. Arlana Pouch on a regular basis.  Past Medical History:  Diagnosis Date   Depression    DM2 (diabetes mellitus, type 2) (HCC)    GERD (gastroesophageal reflux disease)    HLD (hyperlipidemia)    Hypertension    Myocardial infarction [facility])    Neuropathy     Past Surgical History:  Procedure Laterality Date   CARDIAC CATHETERIZATION N/A 11/27/2014   Procedure: Left Heart Cath and Coronary Angiography;  Surgeon:  Iran Ouch, MD;  Location: [facility] INVASIVE CV LAB;  Service: Cardiovascular;  Laterality: N/A;   CARDIAC CATHETERIZATION N/A 11/27/2014   Procedure: Coronary Stent Intervention;  Surgeon: Iran Ouch, MD;  Location: [facility] INVASIVE CV LAB;  Service: Cardiovascular;  Laterality: N/A;     Current Outpatient Medications  Medication Sig Dispense Refill   ALPRAZolam (XANAX) 0.5 MG tablet Take 0.5 mg by mouth 2 (two) times daily as needed for anxiety.     aspirin 81 MG chewable tablet Chew 1 tablet (81 mg total) by mouth daily. 30 tablet 11   atorvastatin (LIPITOR) 80 MG tablet Take 1 tablet (80 mg total) by mouth daily at 6 PM. 30 tablet 11   carvedilol (COREG) 12.5 MG tablet Take 1 tablet (12.5 mg total) by mouth 2 (two) times daily with a meal. 180 tablet 0   gabapentin (NEURONTIN) 300 MG capsule Take 300 mg by mouth daily.     Insulin Degludec (TRESIBA) 100 UNIT/ML SOLN Inject 14 Units into the skin at bedtime.     lisinopril (ZESTRIL) 40 MG tablet Take 1 tablet (40 mg total) by mouth daily. 90 tablet 0   metFORMIN (GLUCOPHAGE) 500 MG tablet Take 1,000 mg by mouth 2 (two) times daily.     nitroGLYCERIN (NITROSTAT) 0.4 MG SL tablet Place 1 tab under tongue every 5 min prn chest pain, not to exceed 3 in 15 min 25 tablet 0   No current facility-administered medications for this visit.    Allergies:   Contrast media [iodinated contrast media] and  Naproxen    Social History:  The patient  reports that he has been smoking cigarettes. He started smoking about 45 years ago. He has a 18.5 pack-year smoking history. He has never used smokeless tobacco. He reports current alcohol use of about 1.0 standard drink of alcohol per week. He reports that he does not use drugs.   Family History:  The patient's family history includes Diabetes in his brother, brother, and father; Hyperlipidemia in his brother; Hypertension in his brother, father, and sister.    ROS:  Please see the history of present  illness.   Otherwise, review of systems are positive for none.   All other systems are reviewed and negative.    PHYSICAL EXAM: VS:  BP (!) 140/88 (BP Location: Left Arm, Patient Position: Sitting, Cuff Size: Normal)   Pulse 74   Ht 6\' 2"  (1.88 m)   Wt 181 lb 2 oz (82.2 kg)   SpO2 98%   BMI 23.26 kg/m  , BMI Body mass index is 23.26 kg/m. GEN: Well nourished, well developed, in no acute distress  HEENT: normal  Neck: no JVD, carotid bruits, or masses Cardiac: RRR; no murmurs, rubs, or gallops,no edema  Respiratory:  clear to auscultation bilaterally, normal work of breathing GI: soft, nontender, nondistended, + BS MS: no deformity or atrophy  Skin: warm and dry, no rash Neuro:  Strength and sensation are intact Psych: euthymic mood, full affect   EKG:  EKG is ordered today. The ekg ordered today demonstrates normal sinus rhythm with inferior T wave changes suggestive of ischemia.  Recent Labs: No results found for requested labs within last 365 days.    Lipid Panel    Component Value Date/Time   CHOL 103 01/12/2015 0813   TRIG 156 (H) 01/12/2015 0813   HDL 21 (L) 01/12/2015 0813   CHOLHDL 4.9 01/12/2015 0813   CHOLHDL 7.9 11/28/2014 0642   VLDL 58 (H) 11/28/2014 0642   LDLCALC 51 01/12/2015 0813      Wt Readings from Last 3 Encounters:  02/19/23 181 lb 2 oz (82.2 kg)  10/31/21 181 lb 6 oz (82.3 kg)  10/18/20 180 lb (81.6 kg)        ASSESSMENT AND PLAN:  1.  Coronary artery disease involving native coronary arteries without angina:   He is doing very well overall with no anginal symptoms.  Continue aspirin indefinitely.  2. Hyperlipidemia: Continue high-dose atorvastatin.  Most recent lipid profile showed an LDL of 45.  He gets his labs done with Dr. Arlana Pouch on a regular basis.  3. Tobacco use: I again discussed with him the importance of smoking cessation and provided him with instructions to help him quit.  4. Ischemic cardiomyopathy: Most recent ejection  fraction was 45 to 50%.  Continue carvedilol and lisinopril.  No evidence of volume overload without any diuretics.  5. Essential hypertension: Blood pressure is reasonably controlled on carvedilol and lisinopril.   Disposition:   FU with me in 12 months  Signed,  Lorine Bears, MD  02/19/2023 8:37 AM    [facility]

## 2023-02-19 ENCOUNTER — Encounter: Payer: Self-pay | Admitting: Cardiovascular Disease

## 2023-02-19 ENCOUNTER — Ambulatory Visit: Payer: Medicare Other | Attending: Cardiovascular Disease | Admitting: Cardiovascular Disease

## 2023-02-19 VITALS — BP 140/88 | HR 74 | Ht 74.0 in | Wt 181.1 lb

## 2023-02-19 DIAGNOSIS — I255 Ischemic cardiomyopathy: Secondary | ICD-10-CM | POA: Diagnosis not present

## 2023-02-19 DIAGNOSIS — E785 Hyperlipidemia, unspecified: Secondary | ICD-10-CM | POA: Diagnosis not present

## 2023-02-19 DIAGNOSIS — I10 Essential (primary) hypertension: Secondary | ICD-10-CM

## 2023-02-19 DIAGNOSIS — I2510 Atherosclerotic heart disease of native coronary artery without angina pectoris: Secondary | ICD-10-CM

## 2023-02-19 DIAGNOSIS — Z720 Tobacco use: Secondary | ICD-10-CM

## 2023-02-19 NOTE — Patient Instructions (Signed)
Follow-Up: At [facility], you and your health needs are our priority.  As part of our continuing mission to provide you with exceptional heart care, we have created designated Provider Care Teams.  These Care Teams include your primary Cardiologist (physician) and Advanced Practice Providers (APPs -  Physician Assistants and Nurse Practitioners) who all work together to provide you with the care you need, when you need it.  We recommend signing up for the patient portal called "MyChart".  Sign up information is provided on this After Visit Summary.  MyChart is used to connect with patients for Virtual Visits (Telemedicine).  Patients are able to view lab/test results, encounter notes, upcoming appointments, etc.  Non-urgent messages can be sent to your provider as well.   To learn more about what you can do with MyChart, go to [email or web address].    Your next appointment:   12 month(s)  Provider:   You may see Lorine Bears, MD or one of the following Advanced Practice Providers on your designated Care Team:   Nicolasa Ducking, NP Eula Listen, PA-C Cadence Fransico Michael, PA-C Charlsie Quest, NP    Other Instructions Managing the Challenge of Quitting Smoking Quitting smoking is a physical and mental challenge. You may have cravings, withdrawal symptoms, and temptation to smoke. Before quitting, work with your health care provider to make a plan that can help you manage quitting. Making a plan before you quit may keep you from smoking when you have the urge to smoke while trying to quit. How to manage lifestyle changes Managing stress Stress can make you want to smoke, and wanting to smoke may cause stress. It is important to find ways to manage your stress. You could try some of the following: Practice relaxation techniques. Breathe slowly and deeply, in through your nose and out through your mouth. Listen to music. Soak in a bath or take a shower. Imagine a peaceful place or  vacation. Get some support. Talk with family or friends about your stress. Join a support group. Talk with a counselor or therapist. Get some physical activity. Go for a walk, run, or bike ride. Play a favorite sport. Practice yoga.  Medicines Talk with your health care provider about medicines that might help you deal with cravings and make quitting easier for you. Relationships Social situations can be difficult when you are quitting smoking. To manage this, you can: Avoid parties and other social situations where people might be smoking. Avoid alcohol. Leave right away if you have the urge to smoke. Explain to your family and friends that you are quitting smoking. Ask for support and let them know you might be a bit grumpy. Plan activities where smoking is not an option. General instructions Be aware that many people gain weight after they quit smoking. However, not everyone does. To keep from gaining weight, have a plan in place before you quit, and stick to the plan after you quit. Your plan should include: Eating healthy snacks. When you have a craving, it may help to: Eat popcorn, or try carrots, celery, or other cut vegetables. Chew sugar-free gum. Changing how you eat. Eat small portion sizes at meals. Eat 4-6 small meals throughout the day instead of 1-2 large meals a day. Be mindful when you eat. You should avoid watching television or doing other things that might distract you as you eat. Exercising regularly. Make time to exercise each day. If you do not have time for a long workout, do short bouts of exercise  for 5-10 minutes several times a day. Do some form of strengthening exercise, such as weight lifting. Do some exercise that gets your heart beating and causes you to breathe deeply, such as walking fast, running, swimming, or biking. This is very important. Drinking plenty of water or other low-calorie or no-calorie drinks. Drink enough fluid to keep your urine pale  yellow.  How to recognize withdrawal symptoms Your body and mind may experience discomfort as you try to get used to not having nicotine in your system. These effects are called withdrawal symptoms. They may include: Feeling hungrier than normal. Having trouble concentrating. Feeling irritable or restless. Having trouble sleeping. Feeling depressed. Craving a cigarette. These symptoms may surprise you, but they are normal to have when quitting smoking. To manage withdrawal symptoms: Avoid places, people, and activities that trigger your cravings. Remember why you want to quit. Get plenty of sleep. Avoid coffee and other drinks that contain caffeine. These may worsen some of your symptoms. How to manage cravings Come up with a plan for how to deal with your cravings. The plan should include the following: A definition of the specific situation you want to deal with. An activity or action you will take to replace smoking. A clear idea for how this action will help. The name of someone who could help you with this. Cravings usually last for 5-10 minutes. Consider taking the following actions to help you with your plan to deal with cravings: Keep your mouth busy. Chew sugar-free gum. Suck on hard candies or a straw. Brush your teeth. Keep your hands and body busy. Change to a different activity right away. Squeeze or play with a ball. Do an activity or a hobby, such as making bead jewelry, practicing needlepoint, or working with wood. Mix up your normal routine. Take a short exercise break. Go for a quick walk, or run up and down stairs. Focus on doing something kind or helpful for someone else. Call a friend or family member to talk during a craving. Join a support group. Contact a quitline. Where to find support To get help or find a support group: Call the National Cancer Institute's Smoking Quitline: 1-800-QUIT-NOW (603)097-4466) Text QUIT to SmokefreeTXT: 147829 Where to find  more information Visit these websites to find more information on quitting smoking: U.S. Department of Health and Human Services: www.smokefree.gov American Lung Association: www.freedomfromsmoking.org Centers for Disease Control and Prevention (CDC): FootballExhibition.com.br American Heart Association: www.heart.org Contact a health care provider if: You want to change your plan for quitting. The medicines you are taking are not helping. Your eating feels out of control or you cannot sleep. You feel depressed or become very anxious. Summary Quitting smoking is a physical and mental challenge. You will face cravings, withdrawal symptoms, and temptation to smoke again. Preparation can help you as you go through these challenges. Try different techniques to manage stress, handle social situations, and prevent weight gain. You can deal with cravings by keeping your mouth busy (such as by chewing gum), keeping your hands and body busy, calling family or friends, or contacting a quitline for people who want to quit smoking. You can deal with withdrawal symptoms by avoiding places where people smoke, getting plenty of rest, and avoiding drinks that contain caffeine. This information is not intended to replace advice given to you by your health care provider. Make sure you discuss any questions you have with your health care provider. Document Revised: 06/14/2021 Document Reviewed: 06/14/2021 Elsevier Patient Education  2024  [employer].

## 2023-03-08 ENCOUNTER — Other Ambulatory Visit: Payer: Self-pay | Admitting: Cardiovascular Disease

## 2023-03-08 DIAGNOSIS — I2510 Atherosclerotic heart disease of native coronary artery without angina pectoris: Secondary | ICD-10-CM

## 2023-03-14 LAB — LAB REPORT - SCANNED
A1c: 9.6
EGFR: 101
HM Hepatitis Screen: NEGATIVE

## 2023-07-07 LAB — LAB REPORT - SCANNED
A1c: 9.2
Albumin, Urine POC: 1340.8
Creatinine, POC: 77.8 mg/dL
EGFR: 101
Microalb Creat Ratio: 1723

## 2023-11-03 LAB — LAB REPORT - SCANNED
A1c: 8
Albumin, Urine POC: 580.5
Creatinine, POC: 52.9 mg/dL
EGFR: 104
Microalb Creat Ratio: 1097

## 2023-11-25 ENCOUNTER — Ambulatory Visit: Payer: Self-pay | Admitting: Internal Medicine

## 2023-11-25 NOTE — Telephone Encounter (Signed)
 Patient wife, Benn Brash, called to schedule pt an appointment to establish care at [facility]. This RN scheduled pt for an appointment.

## 2023-12-01 ENCOUNTER — Other Ambulatory Visit: Payer: Self-pay | Admitting: Cardiovascular Disease

## 2023-12-01 DIAGNOSIS — I2510 Atherosclerotic heart disease of native coronary artery without angina pectoris: Secondary | ICD-10-CM

## 2023-12-02 ENCOUNTER — Telehealth: Payer: Self-pay | Admitting: Cardiovascular Disease

## 2023-12-02 ENCOUNTER — Other Ambulatory Visit: Payer: Self-pay

## 2023-12-02 DIAGNOSIS — I2510 Atherosclerotic heart disease of native coronary artery without angina pectoris: Secondary | ICD-10-CM

## 2023-12-02 MED ORDER — CARVEDILOL 12.5 MG PO TABS: 12.5000 mg | ORAL_TABLET | Freq: Two times a day (BID) | ORAL | 0 refills | Status: DC

## 2023-12-02 NOTE — Telephone Encounter (Signed)
*  STAT* If patient is at the pharmacy, call can be transferred to refill team.   1. Which medications need to be refilled? (please list name of each medication and dose if known)   carvedilol  (COREG ) 12.5 MG tablet   2. Would you like to learn more about the convenience, safety, & potential cost savings by using the [facility]?   3. Are you open to using the [facility] (Type [facility]. ).  4. Which pharmacy/location (including street and city if local pharmacy) is medication to be sent to?  [id number] - [place], [place] - [address]   5. Do they need a 30 day or 90 day supply?   90 day  Wife (Tammy) stated patient has a little medication left.  Wife noted patient has appointment schedule with Dr. Alvenia Aus on 8/19.

## 2023-12-02 NOTE — Telephone Encounter (Signed)
 RX sent to requested Pharmacy

## 2023-12-16 ENCOUNTER — Other Ambulatory Visit: Payer: Self-pay | Admitting: Cardiovascular Disease

## 2023-12-16 DIAGNOSIS — I2510 Atherosclerotic heart disease of native coronary artery without angina pectoris: Secondary | ICD-10-CM

## 2024-01-22 ENCOUNTER — Encounter: Payer: Self-pay | Admitting: Family Medicine

## 2024-01-22 ENCOUNTER — Ambulatory Visit: Admitting: Family Medicine

## 2024-01-22 VITALS — BP 116/67 | HR 70 | Resp 14 | Ht 73.0 in | Wt 174.0 lb

## 2024-01-22 DIAGNOSIS — Z794 Long term (current) use of insulin: Secondary | ICD-10-CM

## 2024-01-22 DIAGNOSIS — F410 Panic disorder [episodic paroxysmal anxiety] without agoraphobia: Secondary | ICD-10-CM

## 2024-01-22 DIAGNOSIS — I5032 Chronic diastolic (congestive) heart failure: Secondary | ICD-10-CM

## 2024-01-22 DIAGNOSIS — E1165 Type 2 diabetes mellitus with hyperglycemia: Secondary | ICD-10-CM | POA: Diagnosis not present

## 2024-01-22 DIAGNOSIS — F17200 Nicotine dependence, unspecified, uncomplicated: Secondary | ICD-10-CM

## 2024-01-22 DIAGNOSIS — F411 Generalized anxiety disorder: Secondary | ICD-10-CM

## 2024-01-22 DIAGNOSIS — I252 Old myocardial infarction: Secondary | ICD-10-CM | POA: Diagnosis not present

## 2024-01-22 DIAGNOSIS — Z79899 Other long term (current) drug therapy: Secondary | ICD-10-CM

## 2024-01-22 DIAGNOSIS — Z7689 Persons encountering health services in other specified circumstances: Secondary | ICD-10-CM

## 2024-01-22 DIAGNOSIS — Z114 Encounter for screening for human immunodeficiency virus [HIV]: Secondary | ICD-10-CM

## 2024-01-22 MED ORDER — ALPRAZOLAM 0.5 MG PO TABS: 0.5000 mg | ORAL_TABLET | Freq: Two times a day (BID) | ORAL | 2 refills | Status: AC | PRN

## 2024-01-22 MED ORDER — EMPAGLIFLOZIN 25 MG PO TABS: 25.0000 mg | ORAL_TABLET | Freq: Every day | ORAL | 11 refills | Status: AC

## 2024-01-22 NOTE — Assessment & Plan Note (Signed)
 Echo 12/17/2018 showed EF 45-50% with impaired relaxation. Left  ventrical global hypokinesis without regional wall motion abnormalities.

## 2024-01-22 NOTE — Progress Notes (Signed)
 New patient visit   Patient: Ryan Bradshaw   DOB: [birth year]   62 y.o. Male  MRN: [id number] Visit Date: 01/22/2024  Today's healthcare provider: LAURAINE LOISE BUOY, DO   Chief Complaint  Patient presents with   Establish Care    Previous provider: Dr. Corlis Last colonoscopy:  [place] 6 years ago   Subjective    Ryan Bradshaw is a 63 y.o. male who presents today as a new patient to establish care.   HPI HPI     Establish Care    Additional comments: Previous provider: Dr. Corlis Last colonoscopy:  [place] 6 years ago      Last edited by Wilfred Hargis RAMAN, CMA on 01/22/2024  1:48 PM.      Ryan Bradshaw is a 63 year old male with type 2 diabetes and anxiety who presents for medication management and follow-up.  He is taking metformin  500 mg twice daily for his type 2 diabetes. Previously, he was taking Jardiance , which effectively lowered his blood sugar levels to around 160 mg/dL, but he ran out of it about a week ago due to cost issues. His most recent A1c was 8%. He is not currently taking [place] as he found Jardiance  sufficient in managing his blood sugar levels when he was on it.  He has a history of anxiety and has been taking Xanax  (alprazolam ) 0.5 mg twice daily for many years. He is concerned about dependency on Xanax , noting anxiety when he does not take it. He has been on this medication since his thirties and has had difficulty discontinuing it in the past.  He has a history of coronary artery disease and had a heart attack in 2016. He is currently taking aspirin , atorvastatin  80 mg, carvedilol  twice daily, and nitroglycerin  as needed. He follows up with his cardiologist and has an appointment scheduled for next month.  He smokes about nine cigarettes a day and has not made significant progress in quitting.  He sometimes experiences tingling in his feet and gets tired when walking up stairs. He had an eye exam three months ago and was advised that his vision might  require a change in glasses next year.     Past Medical History:  Diagnosis Date   Allergy    Anxiety    Arthritis    CHF (congestive heart failure) (HCC)    Depression    DM2 (diabetes mellitus, type 2) (HCC)    GERD (gastroesophageal reflux disease)    HLD (hyperlipidemia)    Hypertension    Myocardial infarction [facility])    Neuropathy    Past Surgical History:  Procedure Laterality Date   CARDIAC CATHETERIZATION N/A 11/27/2014   Procedure: Left Heart Cath and Coronary Angiography;  Surgeon: Deatrice DELENA Cage, MD;  Location: [facility] INVASIVE CV LAB;  Service: Cardiovascular;  Laterality: N/A;   CARDIAC CATHETERIZATION N/A 11/27/2014   Procedure: Coronary Stent Intervention;  Surgeon: Deatrice DELENA Cage, MD;  Location: [facility] INVASIVE CV LAB;  Service: Cardiovascular;  Laterality: N/A;   CARDIAC VALVE REPLACEMENT     Family Status  Relation Name Status   Father  Deceased   Mother  Deceased   Sister  Alive   Brother  Alive   Brother  Alive   Brother  Alive  No partnership data on file   Family History  Problem Relation Age of Onset   Hypertension Father    Diabetes Father    Hypertension Sister    Hypertension Brother  Hyperlipidemia Brother    Diabetes Brother    Diabetes Brother    Social History   Socioeconomic History   Marital status: Married    Spouse name: Not on file   Number of children: Not on file   Years of education: Not on file   Highest education level: Not on file  Occupational History   Not on file  Tobacco Use   Smoking status: Every Day    Current packs/day: 0.00    Average packs/day: 0.5 packs/day for 37.0 years (18.5 ttl pk-yrs)    Types: Cigarettes    Start date: 12/05/1977    Last attempt to quit: 12/06/2014    Years since quitting: 9.1   Smokeless tobacco: Never  Substance and Sexual Activity   Alcohol use: Yes    Alcohol/week: 1.0 standard drink of alcohol    Types: 1 Glasses of wine per week   Drug use: No   Sexual activity: Yes   Other Topics Concern   Not on file  Social History Narrative   Not on file   Social Drivers of Health   Financial Resource Strain: Low Risk  (01/22/2024)   Overall Financial Resource Strain (CARDIA)    Difficulty of Paying Living Expenses: Not hard at all  Food Insecurity: No Food Insecurity (01/22/2024)   Hunger Vital Sign    Worried About Running Out of Food in the Last Year: Never true    Ran Out of Food in the Last Year: Never true  Transportation Needs: No Transportation Needs (01/22/2024)   PRAPARE - Administrator, Civil Service (Medical): No    Lack of Transportation (Non-Medical): No  Physical Activity: Sufficiently Active (01/22/2024)   Exercise Vital Sign    Days of Exercise per Week: 5 days    Minutes of Exercise per Session: 30 min  Stress: No Stress Concern Present (01/22/2024)   [employer] of Occupational Health - Occupational Stress Questionnaire    Feeling of Stress: Not at all  Social Connections: Not on file   Outpatient Medications Prior to Visit  Medication Sig   aspirin  81 MG chewable tablet Chew 1 tablet (81 mg total) by mouth daily.   atorvastatin  (LIPITOR ) 80 MG tablet Take 1 tablet (80 mg total) by mouth daily at 6 PM.   carvedilol  (COREG ) 12.5 MG tablet Take 1 tablet (12.5 mg total) by mouth 2 (two) times daily with a meal.   lansoprazole (PREVACID) 15 MG capsule Take 15 mg by mouth daily at 12 noon.   lisinopril  (ZESTRIL ) 40 MG tablet Take 1 tablet (40 mg total) by mouth daily.   metFORMIN  (GLUCOPHAGE ) 500 MG tablet Take 500 mg by mouth 3 (three) times daily. (Patient taking differently: Take 500 mg by mouth 2 (two) times daily with a meal.)   nitroGLYCERIN  (NITROSTAT ) 0.4 MG SL tablet Place 1 tab under tongue every 5 min prn chest pain, not to exceed 3 in 15 min   [DISCONTINUED] ALPRAZolam  (XANAX ) 0.5 MG tablet Take 0.5 mg by mouth 2 (two) times daily as needed for anxiety.   [DISCONTINUED] JARDIANCE  10 MG TABS tablet    [DISCONTINUED]  gabapentin  (NEURONTIN ) 300 MG capsule Take 300 mg by mouth daily.   [DISCONTINUED] Insulin  Degludec (TRESIBA) 100 UNIT/ML SOLN Inject 14 Units into the skin at bedtime. (Patient not taking: Reported on 01/22/2024)   No facility-administered medications prior to visit.   Allergies  Allergen Reactions   Contrast Media [Iodinated Contrast Media] Shortness Of Breath   Doxycycline  Headache   Naproxen Other (See Comments)    Reaction:  Blood in stools    Immunization History  Administered Date(s) Administered   Moderna Sars-Covid-2 Vaccination 04/04/2020    Health Maintenance  Topic Date Due   Medicare Annual Wellness (AWV)  Never done   FOOT EXAM  Never done   OPHTHALMOLOGY EXAM  Never done   HIV Screening  Never done   DTaP/Tdap/Td (1 - Tdap) Never done   Pneumococcal Vaccine 51-16 Years old (1 of 2 - PCV) Never done   Zoster Vaccines- Shingrix (1 of 2) Never done   Colonoscopy  Never done   COVID-19 Vaccine (2 - Moderna risk series) 04/06/2024 (Originally 05/02/2020)   INFLUENZA VACCINE  02/05/2024   HEMOGLOBIN A1C  05/04/2024   Diabetic kidney evaluation - eGFR measurement  11/02/2024   Diabetic kidney evaluation - Urine ACR  11/02/2024   Hepatitis C Screening  Completed   Hepatitis B Vaccines  Aged Out   HPV VACCINES  Aged Out   Meningococcal B Vaccine  Aged Out    Patient Care Team: Muaad Boehning, Lauraine SAILOR, DO as PCP - General (Family Medicine) Darron Deatrice LABOR, MD as PCP - Cardiology (Cardiology)       Objective    BP 116/67 (BP Location: Left Arm, Patient Position: Sitting, Cuff Size: Normal)   Pulse 70   Resp 14   Ht 6' 1 (1.854 m)   Wt 174 lb (78.9 kg)   SpO2 98%   BMI 22.96 kg/m     Physical Exam Vitals and nursing note reviewed.  Constitutional:      General: He is not in acute distress.    Appearance: Normal appearance.  HENT:     Head: Normocephalic and atraumatic.  Eyes:     General: No scleral icterus.    Conjunctiva/sclera: Conjunctivae  normal.  Cardiovascular:     Rate and Rhythm: Normal rate.  Pulmonary:     Effort: Pulmonary effort is normal.  Neurological:     Mental Status: He is alert and oriented to person, place, and time. Mental status is at baseline.  Psychiatric:        Mood and Affect: Mood normal.        Behavior: Behavior normal.     Depression Screen    01/22/2024    1:32 PM 02/11/2016   11:31 AM  PHQ 2/9 Scores  PHQ - 2 Score 0 0  PHQ- 9 Score 0    No results found for any visits on 01/22/24.  Assessment & Plan     Type 2 diabetes mellitus with hyperglycemia, with long-term current use of insulin  (HCC) -     Microalbumin / creatinine urine ratio -     Comprehensive metabolic panel with GFR -     Hemoglobin A1c -     Lipid panel -     Vitamin B12 -     Empagliflozin ; Take 1 tablet (25 mg total) by mouth daily before breakfast.  Dispense: 30 tablet; Refill: 11 -     AMB Referral VBCI Care Management  Establishing care with new doctor, encounter for  High risk medication use -     Vitamin B12  Generalized anxiety disorder with panic attacks -     ALPRAZolam ; Take 1 tablet (0.5 mg total) by mouth 2 (two) times daily as needed for anxiety.  Dispense: 60 tablet; Refill: 2  Encounter for screening for HIV -     HIV Antibody (routine testing w rflx)  History of ST elevation myocardial infarction (STEMI) Assessment & Plan: Follows with Dr. Darron (cardiology); defer to specialist management.   Nicotine dependence with current use Assessment & Plan: Currently 1.5 ppd; remains precontemplative.   Chronic heart failure with preserved ejection fraction [facility]) Assessment & Plan: Echo 12/17/2018 showed EF 45-50% with impaired relaxation. Left  ventrical global hypokinesis without regional wall motion abnormalities.          Establishing care with a new doctor, encounter for Eye exam completed. Plans to change glasses next year. Has not received current COVID booster. Experiences leg  fatigue on stairs, denies significant dyspnea or chest pain. - Recommend COVID booster when available. - Plan for blood work on Monday.  Type 2 Diabetes Mellitus with hyperglycemia Type 2 diabetes with A1c of 8.0%. Blood glucose 160-170 mg/dL. Jardiance  effective but discontinued due to cost. Metformin  dose suboptimal. [place] not taken as prescribed. - Refill Jardiance  and increase dose. - Refer to pharmacy coordinator for Jardiance  assistance. - Remove Tresiba from medication list. - Adjust metformin  to 1000 mg BID.  Patient denied need for refill of his metformin , stating he had plenty. - Order routine blood work.  Coronary Artery Disease Coronary artery disease with history of myocardial infarction in 2016. Under cardiologist care. No recent chest pain or dyspnea. - Continue current cardiac medications. - Follow up with cardiologist on August 19.  Defer to specialist management  Generalized anxiety disorder with panic attacks Long-standing anxiety managed with alprazolam . Concerns about long-term benzodiazepine use. Discussed transitioning to Lexapro or sertraline.  Patient is very resistant to this switch, despite his concerns. - Discuss tapering alprazolam  and transition to safer medication at future visit.  Hyperlipidemia Hyperlipidemia managed with atorvastatin  80 mg. No issues reported.  Nicotine dependence; smoking cessation counseling Smokes nine cigarettes daily. Aware of cessation methods but not interested in quitting. - Revisit smoking cessation discussion at subsequent appointment.    Follow-up Scheduled for blood work and cardiology appointment on August 19. - Follow up after blood work results. - Follow up with cardiologist on August 19.  Return in about 3 months (around 04/23/2024) for DM, Chronic f/u.     I discussed the assessment and treatment plan with the patient  The patient was provided an opportunity to ask questions and all were answered. The patient  agreed with the plan and demonstrated an understanding of the instructions.   The patient was advised to call back or seek an in-person evaluation if the symptoms worsen or if the condition fails to improve as anticipated.    LAURAINE LOISE BUOY, DO  [facility] [phone] (phone) [phone] (fax)  [facility]

## 2024-01-22 NOTE — Assessment & Plan Note (Signed)
 Currently 1.5 ppd; remains precontemplative.

## 2024-01-22 NOTE — Assessment & Plan Note (Signed)
 Follows with Dr. Darron (cardiology); defer to specialist management.

## 2024-01-25 ENCOUNTER — Telehealth: Payer: Self-pay

## 2024-01-25 NOTE — Progress Notes (Signed)
 Complex Care Management Note Care Guide Note  01/25/2024 Name: Ryan Bradshaw MRN: [id number] DOB: 1960/08/22   Complex Care Management Outreach Attempts: An unsuccessful telephone outreach was attempted today to offer the patient information about available complex care management services.  Follow Up Plan:  No further outreach attempts will be made at this time. We have been unable to contact the patient to offer or enroll patient in complex care management services.  Encounter Outcome:  Patient Refused  [facility] Guide  Direct Dial: [phone]  Fax [phone]

## 2024-02-23 ENCOUNTER — Ambulatory Visit: Attending: Cardiovascular Disease | Admitting: Cardiovascular Disease

## 2024-02-23 NOTE — Progress Notes (Deleted)
 Cardiology Office Note   Date:  02/23/2024   ID:  Ryan, Bradshaw 10/02/1960, MRN [id number]  PCP:  Ryan Lauraine SAILOR, DO  Cardiologist:   Ryan Cage, MD   No chief complaint on file.     History of Present Illness: Ryan Bradshaw is a 63 y.o. male who presents for a follow-up visit regarding coronary artery disease. He has known history of poorly controlled type 2 diabetes, hypertension, tobacco use, hyperlipidemia, peripheral neuropathy, GERD and chronic back pain. He presented to [facility] in May of 2016 with inferior ST elevation myocardial infarction. Cardiac catheterization showed an occluded mid left circumflex with ejection fraction of 50%. He underwent successful angioplasty and drug-eluting stent placement to the mid left circumflex. There was an occluded small distal OM branch which was left to be treated medically. Echocardiogram showed an ejection fraction of 45% with grade 1 diastolic dysfunction.  Nuclear stress test in April 2017 showed evidence of prior inferolateral infarct without significant ischemia.  Most recent echocardiogram in June 2020 showed an EF of 45 to 50% with no significant valvular abnormalities.  Previous lower extremity arterial Doppler in 2017 was normal.  He continues to smoke 10 cigarettes daily.    Has been doing well with no chest pain, shortness of breath or palpitations.  He takes his medications regularly and follows up with Ryan Bradshaw on a regular basis.  Past Medical History:  Diagnosis Date   Allergy    Anxiety    Arthritis    CHF (congestive heart failure) (HCC)    Depression    DM2 (diabetes mellitus, type 2) (HCC)    GERD (gastroesophageal reflux disease)    HLD (hyperlipidemia)    Hypertension    Myocardial infarction [facility])    Neuropathy     Past Surgical History:  Procedure Laterality Date   CARDIAC CATHETERIZATION N/A 11/27/2014   Procedure: Left Heart Cath and Coronary Angiography;  Surgeon: Ryan DELENA Cage,  MD;  Location: [facility] INVASIVE CV LAB;  Service: Cardiovascular;  Laterality: N/A;   CARDIAC CATHETERIZATION N/A 11/27/2014   Procedure: Coronary Stent Intervention;  Surgeon: Ryan DELENA Cage, MD;  Location: [facility] INVASIVE CV LAB;  Service: Cardiovascular;  Laterality: N/A;   CARDIAC VALVE REPLACEMENT       Current Outpatient Medications  Medication Sig Dispense Refill   ALPRAZolam  (XANAX ) 0.5 MG tablet Take 1 tablet (0.5 mg total) by mouth 2 (two) times daily as needed for anxiety. 60 tablet 2   aspirin  81 MG chewable tablet Chew 1 tablet (81 mg total) by mouth daily. 30 tablet 11   atorvastatin  (LIPITOR ) 80 MG tablet Take 1 tablet (80 mg total) by mouth daily at 6 PM. 30 tablet 11   carvedilol  (COREG ) 12.5 MG tablet Take 1 tablet (12.5 mg total) by mouth 2 (two) times daily with a meal. 180 tablet 0   empagliflozin  (JARDIANCE ) 25 MG TABS tablet Take 1 tablet (25 mg total) by mouth daily before breakfast. 30 tablet 11   lansoprazole (PREVACID) 15 MG capsule Take 15 mg by mouth daily at 12 noon.     lisinopril  (ZESTRIL ) 40 MG tablet Take 1 tablet (40 mg total) by mouth daily. 90 tablet 0   metFORMIN  (GLUCOPHAGE ) 500 MG tablet Take 500 mg by mouth 3 (three) times daily. (Patient taking differently: Take 500 mg by mouth 2 (two) times daily with a meal.)     nitroGLYCERIN  (NITROSTAT ) 0.4 MG SL tablet Place 1 tab under tongue every 5 min  prn chest pain, not to exceed 3 in 15 min 25 tablet 0   No current facility-administered medications for this visit.    Allergies:   Contrast media [iodinated contrast media], Doxycycline, and Naproxen    Social History:  The patient  reports that he has been smoking cigarettes. He started smoking about 46 years ago. He has a 18.5 pack-year smoking history. He has never used smokeless tobacco. He reports current alcohol use of about 1.0 standard drink of alcohol per week. He reports that he does not use drugs.   Family History:  The patient's family history  includes Diabetes in his brother, brother, and father; Hyperlipidemia in his brother; Hypertension in his brother, father, and sister.    ROS:  Please see the history of present illness.   Otherwise, review of systems are positive for none.   All other systems are reviewed and negative.    PHYSICAL EXAM: VS:  There were no vitals taken for this visit. , BMI There is no height or weight on file to calculate BMI. GEN: Well nourished, well developed, in no acute distress  HEENT: normal  Neck: no JVD, carotid bruits, or masses Cardiac: RRR; no murmurs, rubs, or gallops,no edema  Respiratory:  clear to auscultation bilaterally, normal work of breathing GI: soft, nontender, nondistended, + BS MS: no deformity or atrophy  Skin: warm and dry, no rash Neuro:  Strength and sensation are intact Psych: euthymic mood, full affect   EKG:  EKG is ordered today. The ekg ordered today demonstrates normal sinus rhythm with inferior T wave changes suggestive of ischemia.  Recent Labs: No results found for requested labs within last 365 days.    Lipid Panel    Component Value Date/Time   CHOL 103 01/12/2015 0813   TRIG 156 (H) 01/12/2015 0813   HDL 21 (L) 01/12/2015 0813   CHOLHDL 4.9 01/12/2015 0813   CHOLHDL 7.9 11/28/2014 0642   VLDL 58 (H) 11/28/2014 0642   LDLCALC 51 01/12/2015 0813      Wt Readings from Last 3 Encounters:  01/22/24 174 lb (78.9 kg)  02/19/23 181 lb 2 oz (82.2 kg)  10/31/21 181 lb 6 oz (82.3 kg)        ASSESSMENT AND PLAN:  1.  Coronary artery disease involving native coronary arteries without angina:   He is doing very well overall with no anginal symptoms.  Continue aspirin  indefinitely.  2. Hyperlipidemia: Continue high-dose atorvastatin .  Most recent lipid profile showed an LDL of 45.  He gets his labs done with Ryan Bradshaw on a regular basis.  3. Tobacco use: I again discussed with him the importance of smoking cessation and provided him with instructions to  help him quit.  4. Ischemic cardiomyopathy: Most recent ejection fraction was 45 to 50%.  Continue carvedilol  and lisinopril .  No evidence of volume overload without any diuretics.  5. Essential hypertension: Blood pressure is reasonably controlled on carvedilol  and lisinopril .   Disposition:   FU with me in 12 months  Signed,  Ryan Cage, MD  02/23/2024 1:28 PM    [facility]

## 2024-02-25 ENCOUNTER — Telehealth: Payer: Self-pay | Admitting: Cardiovascular Disease

## 2024-02-25 DIAGNOSIS — I2510 Atherosclerotic heart disease of native coronary artery without angina pectoris: Secondary | ICD-10-CM

## 2024-02-25 MED ORDER — CARVEDILOL 12.5 MG PO TABS: 12.5000 mg | ORAL_TABLET | Freq: Two times a day (BID) | ORAL | 0 refills | Status: DC

## 2024-02-25 NOTE — Telephone Encounter (Signed)
 RX sent in

## 2024-02-25 NOTE — Telephone Encounter (Signed)
*  STAT* If patient is at the pharmacy, call can be transferred to refill team.   1. Which medications need to be refilled? (please list name of each medication and dose if known)   carvedilol  (COREG ) 12.5 MG tablet   2. Would you like to learn more about the convenience, safety, & potential cost savings by using the [facility]?   3. Are you open to using the [facility] (Type [facility]. ).  4. Which pharmacy/location (including street and city if local pharmacy) is medication to be sent to?  [id number] - [place], [place] - [address]   5. Do they need a 30 day or 90 day supply?   Wife (Tammy) stated he is almost out of this medication.  Patient has appointment scheduled on 10/24 with Dr. Darron.

## 2024-03-17 ENCOUNTER — Encounter: Payer: Self-pay | Admitting: Cardiovascular Disease

## 2024-03-17 ENCOUNTER — Ambulatory Visit: Attending: Cardiovascular Disease | Admitting: Cardiovascular Disease

## 2024-03-17 VITALS — BP 140/90 | HR 77 | Ht 73.0 in | Wt 178.4 lb

## 2024-03-17 DIAGNOSIS — I2510 Atherosclerotic heart disease of native coronary artery without angina pectoris: Secondary | ICD-10-CM | POA: Diagnosis not present

## 2024-03-17 DIAGNOSIS — Z720 Tobacco use: Secondary | ICD-10-CM

## 2024-03-17 DIAGNOSIS — E785 Hyperlipidemia, unspecified: Secondary | ICD-10-CM

## 2024-03-17 DIAGNOSIS — I255 Ischemic cardiomyopathy: Secondary | ICD-10-CM

## 2024-03-17 DIAGNOSIS — I10 Essential (primary) hypertension: Secondary | ICD-10-CM | POA: Diagnosis not present

## 2024-03-17 DIAGNOSIS — I5032 Chronic diastolic (congestive) heart failure: Secondary | ICD-10-CM | POA: Diagnosis not present

## 2024-03-17 MED ORDER — NITROGLYCERIN 0.4 MG SL SUBL: SUBLINGUAL_TABLET | SUBLINGUAL | 1 refills | Status: DC

## 2024-03-17 MED ORDER — CARVEDILOL 12.5 MG PO TABS: 12.5000 mg | ORAL_TABLET | Freq: Two times a day (BID) | ORAL | 3 refills | Status: AC

## 2024-03-17 NOTE — Progress Notes (Signed)
 Cardiology Office Note   Date:  03/17/2024   ID:  Ryan Bradshaw, Ryan Bradshaw 07/23/1960, MRN [id number]  PCP:  Donzella Lauraine SAILOR, DO  Cardiologist:   Deatrice Cage, MD   Chief Complaint  Patient presents with   Follow-up    12 month fu no complaints today. Meds reviewed verbally with pt.      History of Present Illness: Ryan Bradshaw is a 63 y.o. male who presents for a follow-up visit regarding coronary artery disease. He has known history of  type 2 diabetes, hypertension, tobacco use, hyperlipidemia, peripheral neuropathy, GERD and chronic back pain. He presented to [facility] in May of 2016 with inferior ST elevation myocardial infarction. Cardiac catheterization showed an occluded mid left circumflex with ejection fraction of 50%. He underwent successful angioplasty and drug-eluting stent placement to the mid left circumflex. There was an occluded small distal OM branch which was left to be treated medically. Echocardiogram showed an ejection fraction of 45% with grade 1 diastolic dysfunction.  Nuclear stress test in April 2017 showed evidence of prior inferolateral infarct without significant ischemia.  Most recent echocardiogram in June 2020 showed an EF of 45 to 50% with no significant valvular abnormalities.  Previous lower extremity arterial Doppler in 2017 was normal.  He continues to smoke 1 pack/day.  He denies any chest pain, shortness of breath or palpitations.  His blood pressure is mildly elevated today but reports that his blood pressure goes down midday after he takes his medications in the morning.  Diabetes control improved with most recent hemoglobin A1c of 8  Past Medical History:  Diagnosis Date   Allergy    Anxiety    Arthritis    CHF (congestive heart failure) (HCC)    Depression    DM2 (diabetes mellitus, type 2) (HCC)    GERD (gastroesophageal reflux disease)    HLD (hyperlipidemia)    Hypertension    Myocardial infarction [facility])    Neuropathy     Past  Surgical History:  Procedure Laterality Date   CARDIAC CATHETERIZATION N/A 11/27/2014   Procedure: Left Heart Cath and Coronary Angiography;  Surgeon: Deatrice DELENA Cage, MD;  Location: [facility] INVASIVE CV LAB;  Service: Cardiovascular;  Laterality: N/A;   CARDIAC CATHETERIZATION N/A 11/27/2014   Procedure: Coronary Stent Intervention;  Surgeon: Deatrice DELENA Cage, MD;  Location: [facility] INVASIVE CV LAB;  Service: Cardiovascular;  Laterality: N/A;   CARDIAC VALVE REPLACEMENT       Current Outpatient Medications  Medication Sig Dispense Refill   ALPRAZolam  (XANAX ) 0.5 MG tablet Take 1 tablet (0.5 mg total) by mouth 2 (two) times daily as needed for anxiety. 60 tablet 2   aspirin  81 MG chewable tablet Chew 1 tablet (81 mg total) by mouth daily. 30 tablet 11   atorvastatin  (LIPITOR ) 80 MG tablet Take 1 tablet (80 mg total) by mouth daily at 6 PM. 30 tablet 11   carvedilol  (COREG ) 12.5 MG tablet Take 1 tablet (12.5 mg total) by mouth 2 (two) times daily with a meal. 180 tablet 0   empagliflozin  (JARDIANCE ) 25 MG TABS tablet Take 1 tablet (25 mg total) by mouth daily before breakfast. 30 tablet 11   lansoprazole (PREVACID) 15 MG capsule Take 15 mg by mouth daily at 12 noon.     lisinopril  (ZESTRIL ) 40 MG tablet Take 1 tablet (40 mg total) by mouth daily. 90 tablet 0   metFORMIN  (GLUCOPHAGE ) 500 MG tablet Take 500 mg by mouth 3 (three) times daily. (Patient  taking differently: Take 500 mg by mouth 2 (two) times daily with a meal.)     nitroGLYCERIN  (NITROSTAT ) 0.4 MG SL tablet Place 1 tab under tongue every 5 min prn chest pain, not to exceed 3 in 15 min 25 tablet 0   No current facility-administered medications for this visit.    Allergies:   Contrast media [iodinated contrast media], Doxycycline, and Naproxen    Social History:  The patient  reports that he has been smoking cigarettes. He started smoking about 46 years ago. He has a 18.5 pack-year smoking history. He has never used smokeless tobacco.  He reports current alcohol use of about 1.0 standard drink of alcohol per week. He reports that he does not use drugs.   Family History:  The patient's family history includes Diabetes in his brother, brother, and father; Hyperlipidemia in his brother; Hypertension in his brother, father, and sister.    ROS:  Please see the history of present illness.   Otherwise, review of systems are positive for none.   All other systems are reviewed and negative.    PHYSICAL EXAM: VS:  BP (!) 140/90 (BP Location: Left Arm, Patient Position: Sitting, Cuff Size: Normal)   Pulse 77   Ht 6' 1 (1.854 m)   Wt 178 lb 6 oz (80.9 kg)   SpO2 98%   BMI 23.53 kg/m  , BMI Body mass index is 23.53 kg/m. GEN: Well nourished, well developed, in no acute distress  HEENT: normal  Neck: no JVD, carotid bruits, or masses Cardiac: RRR; no murmurs, rubs, or gallops,no edema  Respiratory:  clear to auscultation bilaterally but diminished breath sounds  GI: soft, nontender, nondistended, + BS MS: no deformity or atrophy  Skin: warm and dry, no rash Neuro:  Strength and sensation are intact Psych: euthymic mood, full affect   EKG:  EKG is ordered today. The ekg ordered today demonstrates : Normal sinus rhythm ST & T wave abnormality, consider inferior ischemia When compared with ECG of 19-Feb-2023 08:09, No significant change was found    Recent Labs: No results found for requested labs within last 365 days.    Lipid Panel    Component Value Date/Time   CHOL 103 01/12/2015 0813   TRIG 156 (H) 01/12/2015 0813   HDL 21 (L) 01/12/2015 0813   CHOLHDL 4.9 01/12/2015 0813   CHOLHDL 7.9 11/28/2014 0642   VLDL 58 (H) 11/28/2014 0642   LDLCALC 51 01/12/2015 0813      Wt Readings from Last 3 Encounters:  03/17/24 178 lb 6 oz (80.9 kg)  01/22/24 174 lb (78.9 kg)  02/19/23 181 lb 2 oz (82.2 kg)        ASSESSMENT AND PLAN:  1.  Coronary artery disease involving native coronary arteries without angina:    He is doing very well overall with no anginal symptoms.  Continue aspirin  indefinitely.  2. Hyperlipidemia: Continue high-dose atorvastatin .  I reviewed most recent lipid profile which showed an LDL of 44.  3. Tobacco use: I again discussed with him the importance of smoking cessation and provided him with instructions to help him quit.  His lung exam is remarkable for diminished breath sounds bilaterally which seems to be new.  I discussed this with him today.  I also advised him to get CT chest for cancer screening and this was already discussed with him by his primary care provider.  4. Ischemic cardiomyopathy: Most recent ejection fraction was 45 to 50%.  Continue carvedilol  and lisinopril .  No evidence of volume overload without any diuretics.  I refilled carvedilol .  5. Essential hypertension: Blood pressure is mildly elevated but his blood pressure is usually controlled and it goes down after he takes his medications.   Disposition:   FU with me in 12 months  Signed,  Deatrice Cage, MD  03/17/2024 9:16 AM    [facility]

## 2024-03-17 NOTE — Patient Instructions (Signed)
 Medication Instructions:  No changes *If you need a refill on your cardiac medications before your next appointment, please call your pharmacy*  Lab Work: None ordered If you have labs (blood work) drawn today and your tests are completely normal, you will receive your results only by: MyChart Message (if you have MyChart) OR A paper copy in the mail If you have any lab test that is abnormal or we need to change your treatment, we will call you to review the results.  Testing/Procedures: None ordered  Follow-Up: At [facility], you and your health needs are our priority.  As part of our continuing mission to provide you with exceptional heart care, our providers are all part of one team.  This team includes your primary Cardiologist (physician) and Advanced Practice Providers or APPs (Physician Assistants and Nurse Practitioners) who all work together to provide you with the care you need, when you need it.  Your next appointment:   12 month(s)  Provider:   You may see Deatrice Cage, MD or one of the following Advanced Practice Providers on your designated Care Team:   Lonni Meager, NP Lesley Maffucci, PA-C Bernardino Bring, PA-C Cadence [place], PA-C Tylene Lunch, NP Barnie Hila, NP    We recommend signing up for the patient portal called MyChart.  Sign up information is provided on this After Visit Summary.  MyChart is used to connect with patients for Virtual Visits (Telemedicine).  Patients are able to view lab/test results, encounter notes, upcoming appointments, etc.  Non-urgent messages can be sent to your provider as well.   To learn more about what you can do with MyChart, go to [email or web address].   Other Instructions Managing the Challenge of Quitting Smoking Quitting smoking is a physical and mental challenge. You may have cravings, withdrawal symptoms, and temptation to smoke. Before quitting, work with your health care provider to make a plan that  can help you manage quitting. Making a plan before you quit may keep you from smoking when you have the urge to smoke while trying to quit. How to manage lifestyle changes Managing stress Stress can make you want to smoke, and wanting to smoke may cause stress. It is important to find ways to manage your stress. You could try some of the following: Practice relaxation techniques. Breathe slowly and deeply, in through your nose and out through your mouth. Listen to music. Soak in a bath or take a shower. Imagine a peaceful place or vacation. Get some support. Talk with family or friends about your stress. Join a support group. Talk with a counselor or therapist. Get some physical activity. Go for a walk, run, or bike ride. Play a favorite sport. Practice yoga.  Medicines Talk with your health care provider about medicines that might help you deal with cravings and make quitting easier for you. Relationships Social situations can be difficult when you are quitting smoking. To manage this, you can: Avoid parties and other social situations where people might be smoking. Avoid alcohol. Leave right away if you have the urge to smoke. Explain to your family and friends that you are quitting smoking. Ask for support and let them know you might be a bit grumpy. Plan activities where smoking is not an option. General instructions Be aware that many people gain weight after they quit smoking. However, not everyone does. To keep from gaining weight, have a plan in place before you quit, and stick to the plan after you quit. Your plan should  include: Eating healthy snacks. When you have a craving, it may help to: Eat popcorn, or try carrots, celery, or other cut vegetables. Chew sugar-free gum. Changing how you eat. Eat small portion sizes at meals. Eat 4-6 small meals throughout the day instead of 1-2 large meals a day. Be mindful when you eat. You should avoid watching television or doing  other things that might distract you as you eat. Exercising regularly. Make time to exercise each day. If you do not have time for a long workout, do short bouts of exercise for 5-10 minutes several times a day. Do some form of strengthening exercise, such as weight lifting. Do some exercise that gets your heart beating and causes you to breathe deeply, such as walking fast, running, swimming, or biking. This is very important. Drinking plenty of water or other low-calorie or no-calorie drinks. Drink enough fluid to keep your urine pale yellow.  How to recognize withdrawal symptoms Your body and mind may experience discomfort as you try to get used to not having nicotine in your system. These effects are called withdrawal symptoms. They may include: Feeling hungrier than normal. Having trouble concentrating. Feeling irritable or restless. Having trouble sleeping. Feeling depressed. Craving a cigarette. These symptoms may surprise you, but they are normal to have when quitting smoking. To manage withdrawal symptoms: Avoid places, people, and activities that trigger your cravings. Remember why you want to quit. Get plenty of sleep. Avoid coffee and other drinks that contain caffeine. These may worsen some of your symptoms. How to manage cravings Come up with a plan for how to deal with your cravings. The plan should include the following: A definition of the specific situation you want to deal with. An activity or action you will take to replace smoking. A clear idea for how this action will help. The name of someone who could help you with this. Cravings usually last for 5-10 minutes. Consider taking the following actions to help you with your plan to deal with cravings: Keep your mouth busy. Chew sugar-free gum. Suck on hard candies or a straw. Brush your teeth. Keep your hands and body busy. Change to a different activity right away. Squeeze or play with a ball. Do an activity or  a hobby, such as making bead jewelry, practicing needlepoint, or working with wood. Mix up your normal routine. Take a short exercise break. Go for a quick walk, or run up and down stairs. Focus on doing something kind or helpful for someone else. Call a friend or family member to talk during a craving. Join a support group. Contact a quitline. Where to find support To get help or find a support group: Call the National Cancer Institute's Smoking Quitline: 1-800-QUIT-NOW 417-636-9713) Text QUIT to SmokefreeTXT: 521151 Where to find more information Visit these websites to find more information on quitting smoking: U.S. Department of Health and Human Services: www.smokefree.gov American Lung Association: www.freedomfromsmoking.org Centers for Disease Control and Prevention (CDC): FootballExhibition.com.br American Heart Association: www.heart.org Contact a health care provider if: You want to change your plan for quitting. The medicines you are taking are not helping. Your eating feels out of control or you cannot sleep. You feel depressed or become very anxious. Summary Quitting smoking is a physical and mental challenge. You will face cravings, withdrawal symptoms, and temptation to smoke again. Preparation can help you as you go through these challenges. Try different techniques to manage stress, handle social situations, and prevent weight gain. You can deal with  cravings by keeping your mouth busy (such as by chewing gum), keeping your hands and body busy, calling family or friends, or contacting a quitline for people who want to quit smoking. You can deal with withdrawal symptoms by avoiding places where people smoke, getting plenty of rest, and avoiding drinks that contain caffeine. This information is not intended to replace advice given to you by your health care provider. Make sure you discuss any questions you have with your health care provider. Document Revised: 06/14/2021 Document Reviewed:  06/14/2021 Elsevier Patient Education  2024 [employer].

## 2024-04-14 ENCOUNTER — Telehealth: Payer: Self-pay | Admitting: Cardiovascular Disease

## 2024-04-14 NOTE — Telephone Encounter (Signed)
    Primary Cardiologist: Deatrice Cage, MD  Chart reviewed as part of pre-operative protocol coverage. Simple dental extractions are considered low risk procedures per guidelines and generally do not require any specific cardiac clearance. It is also generally accepted that for simple extractions and dental cleanings, there is no need to interrupt blood thinner therapy.   SBE prophylaxis is not required for the patient.  I will route this recommendation to the requesting party via Epic fax function and remove from pre-op pool.  Please call with questions.  Josefa CHRISTELLA Beauvais, NP 04/14/2024, 10:33 AM

## 2024-04-14 NOTE — Telephone Encounter (Signed)
   Pre-operative Risk Assessment    Patient Name: Ryan Bradshaw  DOB: [birth year] MRN: [id number]   Date of last office visit: 03/17/24 Date of next office visit: n/a  Request for Surgical Clearance    Procedure:  regular cleaning and crowns  Date of Surgery:  Clearance TBD                                Surgeon:  Dr Monika Callow Surgeon's Group or Practice Name:  [facility] Phone number:  [phone] Fax number:  [phone]   Type of Clearance Requested:   - Medical    Type of Anesthesia:  Not Indicated   Additional requests/questions:    Bonney Rosina Stamps   04/14/2024, 10:29 AM

## 2024-04-20 ENCOUNTER — Ambulatory Visit

## 2024-04-29 ENCOUNTER — Ambulatory Visit: Admitting: Cardiovascular Disease

## 2024-04-29 ENCOUNTER — Ambulatory Visit: Admitting: Family Medicine

## 2024-05-10 ENCOUNTER — Other Ambulatory Visit: Payer: Self-pay | Admitting: Cardiovascular Disease

## 2024-05-10 DIAGNOSIS — I2510 Atherosclerotic heart disease of native coronary artery without angina pectoris: Secondary | ICD-10-CM
